# Patient Record
Sex: Female | Born: 1935 | Race: Black or African American | Hispanic: No | Smoking: Never smoker
Health system: Southern US, Community
[De-identification: ages and names within clinical notes are randomized; demographics above are authoritative.]

## PROBLEM LIST (undated history)

## (undated) DIAGNOSIS — M1990 Unspecified osteoarthritis, unspecified site: Secondary | ICD-10-CM

## (undated) DIAGNOSIS — I10 Essential (primary) hypertension: Secondary | ICD-10-CM

## (undated) HISTORY — DX: Essential (primary) hypertension: I10

## (undated) HISTORY — PX: HERNIA REPAIR: SHX51

## (undated) HISTORY — PX: ABDOMINAL HYSTERECTOMY: SHX81

## (undated) HISTORY — PX: APPENDECTOMY: SHX54

## (undated) HISTORY — DX: Unspecified osteoarthritis, unspecified site: M19.90

---

## 1997-08-21 ENCOUNTER — Ambulatory Visit (HOSPITAL_COMMUNITY): Admission: RE | Admit: 1997-08-21 | Discharge: 1997-08-21 | Payer: Self-pay | Admitting: Cardiology

## 1997-12-11 ENCOUNTER — Ambulatory Visit: Admission: RE | Admit: 1997-12-11 | Discharge: 1997-12-11 | Payer: Self-pay | Admitting: *Deleted

## 1998-12-17 ENCOUNTER — Ambulatory Visit (HOSPITAL_COMMUNITY): Admission: RE | Admit: 1998-12-17 | Discharge: 1998-12-17 | Payer: Self-pay | Admitting: *Deleted

## 1998-12-17 ENCOUNTER — Encounter: Payer: Self-pay | Admitting: *Deleted

## 1999-09-05 ENCOUNTER — Encounter: Payer: Self-pay | Admitting: *Deleted

## 1999-09-05 ENCOUNTER — Ambulatory Visit (HOSPITAL_COMMUNITY): Admission: RE | Admit: 1999-09-05 | Discharge: 1999-09-05 | Payer: Self-pay | Admitting: *Deleted

## 1999-12-18 ENCOUNTER — Encounter: Payer: Self-pay | Admitting: Cardiology

## 1999-12-18 ENCOUNTER — Ambulatory Visit (HOSPITAL_COMMUNITY): Admission: RE | Admit: 1999-12-18 | Discharge: 1999-12-18 | Payer: Self-pay | Admitting: Cardiology

## 2000-03-28 ENCOUNTER — Emergency Department (HOSPITAL_COMMUNITY): Admission: EM | Admit: 2000-03-28 | Discharge: 2000-03-28 | Payer: Self-pay | Admitting: Internal Medicine

## 2000-08-04 ENCOUNTER — Encounter: Payer: Self-pay | Admitting: Family Medicine

## 2000-08-04 ENCOUNTER — Encounter: Admission: RE | Admit: 2000-08-04 | Discharge: 2000-08-04 | Payer: Self-pay | Admitting: Family Medicine

## 2000-08-23 ENCOUNTER — Ambulatory Visit (HOSPITAL_COMMUNITY): Admission: RE | Admit: 2000-08-23 | Discharge: 2000-08-24 | Payer: Self-pay | Admitting: General Surgery

## 2000-08-23 ENCOUNTER — Encounter (INDEPENDENT_AMBULATORY_CARE_PROVIDER_SITE_OTHER): Payer: Self-pay | Admitting: *Deleted

## 2000-08-23 ENCOUNTER — Encounter (HOSPITAL_BASED_OUTPATIENT_CLINIC_OR_DEPARTMENT_OTHER): Payer: Self-pay | Admitting: General Surgery

## 2000-12-20 ENCOUNTER — Encounter: Payer: Self-pay | Admitting: Family Medicine

## 2000-12-20 ENCOUNTER — Ambulatory Visit (HOSPITAL_COMMUNITY): Admission: RE | Admit: 2000-12-20 | Discharge: 2000-12-20 | Payer: Self-pay | Admitting: Family Medicine

## 2001-07-28 ENCOUNTER — Other Ambulatory Visit: Admission: RE | Admit: 2001-07-28 | Discharge: 2001-07-28 | Payer: Self-pay | Admitting: Family Medicine

## 2001-10-04 ENCOUNTER — Encounter: Admission: RE | Admit: 2001-10-04 | Discharge: 2001-10-04 | Payer: Self-pay | Admitting: Family Medicine

## 2001-10-04 ENCOUNTER — Encounter: Payer: Self-pay | Admitting: Family Medicine

## 2001-11-02 ENCOUNTER — Encounter: Admission: RE | Admit: 2001-11-02 | Discharge: 2001-11-02 | Payer: Self-pay | Admitting: Family Medicine

## 2001-11-02 ENCOUNTER — Encounter: Payer: Self-pay | Admitting: Family Medicine

## 2001-11-10 ENCOUNTER — Encounter (HOSPITAL_BASED_OUTPATIENT_CLINIC_OR_DEPARTMENT_OTHER): Payer: Self-pay | Admitting: General Surgery

## 2001-11-14 ENCOUNTER — Ambulatory Visit (HOSPITAL_COMMUNITY): Admission: RE | Admit: 2001-11-14 | Discharge: 2001-11-15 | Payer: Self-pay | Admitting: General Surgery

## 2001-11-14 ENCOUNTER — Encounter (INDEPENDENT_AMBULATORY_CARE_PROVIDER_SITE_OTHER): Payer: Self-pay | Admitting: Specialist

## 2001-11-25 ENCOUNTER — Encounter: Payer: Self-pay | Admitting: Family Medicine

## 2001-11-25 ENCOUNTER — Encounter: Admission: RE | Admit: 2001-11-25 | Discharge: 2001-11-25 | Payer: Self-pay | Admitting: Family Medicine

## 2002-12-01 ENCOUNTER — Encounter: Admission: RE | Admit: 2002-12-01 | Discharge: 2002-12-01 | Payer: Self-pay | Admitting: Family Medicine

## 2002-12-01 ENCOUNTER — Encounter: Payer: Self-pay | Admitting: Family Medicine

## 2003-01-03 ENCOUNTER — Ambulatory Visit (HOSPITAL_COMMUNITY): Admission: RE | Admit: 2003-01-03 | Discharge: 2003-01-03 | Payer: Self-pay | Admitting: Family Medicine

## 2003-01-05 ENCOUNTER — Ambulatory Visit (HOSPITAL_COMMUNITY): Admission: RE | Admit: 2003-01-05 | Discharge: 2003-01-05 | Payer: Self-pay | Admitting: Family Medicine

## 2003-01-08 ENCOUNTER — Encounter (INDEPENDENT_AMBULATORY_CARE_PROVIDER_SITE_OTHER): Payer: Self-pay | Admitting: Specialist

## 2003-01-08 ENCOUNTER — Ambulatory Visit (HOSPITAL_COMMUNITY): Admission: RE | Admit: 2003-01-08 | Discharge: 2003-01-08 | Payer: Self-pay | Admitting: Vascular Surgery

## 2003-12-10 ENCOUNTER — Ambulatory Visit (HOSPITAL_COMMUNITY): Admission: RE | Admit: 2003-12-10 | Discharge: 2003-12-10 | Payer: Self-pay | Admitting: Gastroenterology

## 2003-12-13 ENCOUNTER — Ambulatory Visit (HOSPITAL_COMMUNITY): Admission: RE | Admit: 2003-12-13 | Discharge: 2003-12-13 | Payer: Self-pay | Admitting: Gastroenterology

## 2004-04-29 ENCOUNTER — Emergency Department (HOSPITAL_COMMUNITY): Admission: EM | Admit: 2004-04-29 | Discharge: 2004-04-29 | Payer: Self-pay | Admitting: Family Medicine

## 2004-06-17 ENCOUNTER — Encounter: Admission: RE | Admit: 2004-06-17 | Discharge: 2004-06-17 | Payer: Self-pay | Admitting: Family Medicine

## 2004-09-24 ENCOUNTER — Emergency Department (HOSPITAL_COMMUNITY): Admission: EM | Admit: 2004-09-24 | Discharge: 2004-09-24 | Payer: Self-pay | Admitting: Emergency Medicine

## 2005-06-19 ENCOUNTER — Encounter: Admission: RE | Admit: 2005-06-19 | Discharge: 2005-06-19 | Payer: Self-pay | Admitting: Family Medicine

## 2005-08-31 ENCOUNTER — Ambulatory Visit (HOSPITAL_COMMUNITY): Admission: RE | Admit: 2005-08-31 | Discharge: 2005-08-31 | Payer: Self-pay | Admitting: General Surgery

## 2006-07-09 ENCOUNTER — Encounter: Admission: RE | Admit: 2006-07-09 | Discharge: 2006-07-09 | Payer: Self-pay | Admitting: Family Medicine

## 2006-10-07 ENCOUNTER — Emergency Department (HOSPITAL_COMMUNITY): Admission: EM | Admit: 2006-10-07 | Discharge: 2006-10-07 | Payer: Self-pay | Admitting: Emergency Medicine

## 2006-10-12 ENCOUNTER — Encounter: Admission: RE | Admit: 2006-10-12 | Discharge: 2006-10-12 | Payer: Self-pay | Admitting: Family Medicine

## 2007-07-11 ENCOUNTER — Encounter: Admission: RE | Admit: 2007-07-11 | Discharge: 2007-07-11 | Payer: Self-pay | Admitting: Family Medicine

## 2008-02-27 ENCOUNTER — Encounter: Admission: RE | Admit: 2008-02-27 | Discharge: 2008-02-27 | Payer: Self-pay | Admitting: Family Medicine

## 2008-04-04 ENCOUNTER — Emergency Department (HOSPITAL_COMMUNITY): Admission: EM | Admit: 2008-04-04 | Discharge: 2008-04-04 | Payer: Self-pay | Admitting: Emergency Medicine

## 2008-04-10 ENCOUNTER — Ambulatory Visit (HOSPITAL_BASED_OUTPATIENT_CLINIC_OR_DEPARTMENT_OTHER): Admission: RE | Admit: 2008-04-10 | Discharge: 2008-04-10 | Payer: Self-pay | Admitting: Urology

## 2008-08-03 ENCOUNTER — Encounter: Admission: RE | Admit: 2008-08-03 | Discharge: 2008-08-03 | Payer: Self-pay | Admitting: Family Medicine

## 2010-02-27 LAB — URINALYSIS, ROUTINE W REFLEX MICROSCOPIC
Bilirubin Urine: NEGATIVE
Hemoglobin, Urine: NEGATIVE
Ketones, ur: NEGATIVE mg/dL
Nitrite: NEGATIVE
Protein, ur: NEGATIVE mg/dL
Specific Gravity, Urine: 1.011 (ref 1.005–1.030)
Urine Glucose, Fasting: NEGATIVE mg/dL
Urobilinogen, UA: 0.2 mg/dL (ref 0.0–1.0)
pH: 6 (ref 5.0–8.0)

## 2010-02-27 LAB — BASIC METABOLIC PANEL
BUN: 10 mg/dL (ref 6–23)
CO2: 26 mEq/L (ref 19–32)
Calcium: 9.5 mg/dL (ref 8.4–10.5)
Chloride: 108 mEq/L (ref 96–112)
Creatinine, Ser: 0.68 mg/dL (ref 0.4–1.2)
GFR calc Af Amer: >60 mL/min (ref >60)
GFR calc non Af Amer: >60 mL/min (ref >60)
Glucose, Bld: 83 mg/dL (ref 70–99)
Potassium: 4 mEq/L (ref 3.5–5.1)
Sodium: 141 mEq/L (ref 135–145)

## 2010-02-27 LAB — CBC
HCT: 39.3 % (ref 36.0–46.0)
Hemoglobin: 13.7 g/dL (ref 12.0–15.0)
MCH: 33 pg (ref 26.0–34.0)
MCHC: 34.9 g/dL (ref 30.0–36.0)
MCV: 94.7 fL (ref 78.0–100.0)
Platelets: 221 10*3/uL (ref 150–400)
RBC: 4.15 MIL/uL (ref 3.87–5.11)
RDW: 13.9 % (ref 11.5–15.5)
WBC: 4.9 10*3/uL (ref 4.0–10.5)

## 2010-02-27 LAB — URINE MICROSCOPIC-ADD ON

## 2010-02-27 LAB — PROTIME-INR
INR: 1.08 (ref 0.00–1.49)
Prothrombin Time: 14.2 seconds (ref 11.6–15.2)

## 2010-02-27 LAB — DIFFERENTIAL
Basophils Absolute: 0 10*3/uL (ref 0.0–0.1)
Basophils Relative: 0 % (ref 0–1)
Eosinophils Absolute: 0.2 10*3/uL (ref 0.0–0.7)
Eosinophils Relative: 5 % (ref 0–5)
Lymphocytes Relative: 38 % (ref 12–46)
Lymphs Abs: 1.9 10*3/uL (ref 0.7–4.0)
Monocytes Absolute: 0.4 10*3/uL (ref 0.1–1.0)
Monocytes Relative: 7 % (ref 3–12)
Neutro Abs: 2.5 10*3/uL (ref 1.7–7.7)
Neutrophils Relative %: 50 % (ref 43–77)

## 2010-03-03 ENCOUNTER — Encounter (INDEPENDENT_AMBULATORY_CARE_PROVIDER_SITE_OTHER): Payer: Self-pay | Admitting: Surgery

## 2010-03-03 ENCOUNTER — Ambulatory Visit (HOSPITAL_COMMUNITY)
Admission: RE | Admit: 2010-03-03 | Discharge: 2010-03-04 | Payer: Self-pay | Source: Home / Self Care | Attending: Surgery | Admitting: Surgery

## 2010-03-15 ENCOUNTER — Encounter: Payer: Self-pay | Admitting: Family Medicine

## 2010-03-16 NOTE — Op Note (Signed)
Sydney Mason, PAONE              ACCOUNT NO.:  [id number]  MEDICAL RECORD NO.:  [id number]          PATIENT TYPE:  OIB  LOCATION:  5151                         FACILITY:  [facility]  PHYSICIAN:  Velora Heckler, MD      DATE OF BIRTH:  14-Apr-1921  DATE OF PROCEDURE:  03/03/2010 DATE OF DISCHARGE:                              OPERATIVE REPORT   PREOPERATIVE DIAGNOSIS:  Incarcerated incisional hernia, recurrent.  POSTOPERATIVE DIAGNOSIS:  Incarcerated incisional hernia, recurrent.  PROCEDURES: 1. Repair of recurrent incarcerated ventral incisional hernia with     mesh. 2. Excision of previous mesh from abdominal wall.  SURGEON:  Velora Heckler, MD  ANESTHESIA:  General per Dr. Claybon Jabs.  ESTIMATED BLOOD LOSS:  Minimal.  PREPARATION:  ChloraPrep.  COMPLICATIONS:  None.  INDICATIONS:  The patient is an 77 year old black female from [place], [place].  She has a recurrent ventral incisional hernia.  This had previously been repaired by Dr. Dominga Ferry.  Hernia had recurred over the last year and had become progressively larger with intermittent discomfort.  She now comes to the operating room for repair of recurrent ventral incisional hernia.  BODY OF REPORT:  Procedure was done in OR #3 at the [facility].  The patient was brought to the operating room and placed in supine position on the operating room table.  Following administration of general anesthesia, the patient was prepped and draped in usual strict aseptic fashion.  After ascertaining that an adequate level of anesthesia had been achieved, the previous midline incision was reopened with a #15 blade.  Dissection was carried down through the subcutaneous tissues and the hernia sac was identified.  Hernia sac was dissected out.  It was opened.  It contains incarcerated omentum and a segment of transverse colon.  Using sharp dissection with Metzenbaum scissors, the omentum and transverse  colon were freed from the inside of the hernia sac and reduced back within the peritoneal cavity.  Hernia sac was then excised and discarded.  Fascial edges were dissected out circumferentially.  The previously placed mesh appears to have all contracted to the right side of the hernia defect.  Subcutaneous flaps were elevated circumferentially.  The old mesh and its suture material and titanium tacks are resected from the right side of the abdominal wall using the electrocautery for hemostasis.  The old mesh was submitted to pathology for review.  Next, the midline wound was closed with interrupted #1 Novafil simple sutures.  There was good approximation of the fascia of the abdominal wall without undue tension.  Fascial plane was developed circumferentially to allow for a 3-inch x 6-inch sheet of polypropylene mesh to be applied as an onlay.  The corners of the mesh were trimmed slightly.  Mesh was placed over the fascia and was then secured circumferentially with interrupted #1 and 0 Novafil simple sutures. Good hemostasis was noted.  Umbilicus was reaffixed to the abdominal wall with an interrupted 0 Novafil suture.  A 19-French Blake drain was brought in from right lower quadrant stab wound and placed anterior to the mesh.  Subcutaneous tissues were then  closed with interrupted 2-0 Vicryl sutures.  Skin was closed with stainless steel staples.  Drain was placed to bulb suction.  The patient was awakened from anesthesia and brought to the recovery room.  The patient tolerated the procedure well.     Velora Heckler, MD     TMG/MEDQ  D:  03/03/2010  T:  03/04/2010  Job:  [id number]  cc:   Renaye Rakers, M.D.  Electronically Signed by Darnell Level MD on 03/16/2010 09:58:01 AM

## 2010-03-16 NOTE — Discharge Summary (Signed)
  NAMESEMIRA, Sydney Mason              ACCOUNT NO.:  [id number]  MEDICAL RECORD NO.:  [id number]          PATIENT TYPE:  OIB  LOCATION:  5151                         FACILITY:  [facility]  PHYSICIAN:  Velora Heckler, MD      DATE OF BIRTH:  21-Jun-1921  DATE OF ADMISSION:  03/03/2010 DATE OF DISCHARGE:  03/04/2010                              DISCHARGE SUMMARY   REASON FOR ADMISSION:  Recurrent ventral incisional hernia.  BRIEF HISTORY:  The patient is an 77 year old black female from [place], [place].  She has a history of ventral hernia repaired by Dr. Leonie Man approximately 3 years ago with mesh. Over the past year she has noted recurrence.  It has become progressively larger and causes intermittent discomfort.  She was evaluated by Dr. Renaye Rakers and referred for repair.  HOSPITAL COURSE:  The patient was admitted on January 9 and taken directly to the operating room.  She underwent repair of recurrent incarcerated ventral incisional hernia.  She had removal of her previous mesh.  Postoperative course was straightforward.  The patient does have a subcutaneous drain in place.  She remained stable overnight and was prepared for discharge home on the first postoperative day.  DISCHARGE PLANNING:  The patient is discharged home today, March 04, 2010, in good condition, tolerating a regular diet, and ambulating independently.  Discharge medication include Vicodin for pain. Arrangements were made for home health nursing from [facility]. The patient will be seen back in my office at [facility] in 3 days for wound check and possible drain removal.  FINAL DIAGNOSIS:  Recurrent ventral incisional hernia, incarcerated.  CONDITION AT DISCHARGE:  Good.     Velora Heckler, MD     TMG/MEDQ  D:  03/04/2010  T:  03/04/2010  Job:  [id number]  cc:   Renaye Rakers, M.D.  Electronically Signed by Darnell Level MD on 03/16/2010 09:57:53 AM

## 2010-03-20 LAB — SURGICAL PCR SCREEN: MRSA, PCR: NEGATIVE

## 2010-06-10 LAB — URINE CULTURE: Culture: NO GROWTH

## 2010-06-10 LAB — URINALYSIS, ROUTINE W REFLEX MICROSCOPIC
Glucose, UA: NEGATIVE mg/dL
Protein, ur: NEGATIVE mg/dL
Urobilinogen, UA: 0.2 mg/dL (ref 0.0–1.0)

## 2010-06-10 LAB — URINE MICROSCOPIC-ADD ON

## 2010-06-10 LAB — BASIC METABOLIC PANEL
Chloride: 105 mEq/L (ref 96–112)
Creatinine, Ser: 0.71 mg/dL (ref 0.4–1.2)
GFR calc Af Amer: >60 mL/min (ref >60)

## 2010-06-10 LAB — CBC
Hemoglobin: 12.8 g/dL (ref 12.0–15.0)
RBC: 3.85 MIL/uL — ABNORMAL LOW (ref 3.87–5.11)
WBC: 4.8 10*3/uL (ref 4.0–10.5)

## 2010-07-08 NOTE — Op Note (Signed)
Sydney Mason, Sydney Mason              ACCOUNT NO.:  [id number]   MEDICAL RECORD NO.:  [id number]          PATIENT TYPE:  AMB   LOCATION:  NESC                         FACILITY:  [facility]   PHYSICIAN:  Ronald L. Earlene Plater, M.D.  DATE OF BIRTH:  19-Jul-1921   DATE OF PROCEDURE:  04/10/2008  DATE OF DISCHARGE:                               OPERATIVE REPORT   ASSISTANT:  Dr. Duane Boston.   PREOPERATIVE DIAGNOSIS:  Left distal ureteral stone.   POSTOPERATIVE DIAGNOSIS:  Left distal ureteral stone.   PROCEDURES:  1. Hand-assisted urethroscopy.  2. Left-sided retrograde pyelogram.  3. Left-sided ureteroscopy with laser lithotripsy and basket stone      extraction.  4. Left-sided double-J ureteral stent placement, 7-French x 24 cm.   INDICATIONS:  This is an 77 year old female with a history of left  distal ureteral stone.  After discussion of the risks and benefits, she  elected to proceed with a left ureteroscopic stone manipulation.   FINDINGS:  1. Left distal ureteral narrowing/stricture/impacted stone.  2. Uncomplicated laser lithotripsy of stone and basket of fragments.  3. Uncomplicated placement of left double-J stent.   PROCEDURE IN DETAIL:  The patient was brought back to the operating room  and after the successful induction of LMA anesthetic, she was placed in  the dorsal lithotomy position.  All pressure points were padded  appropriately and SCD were in place.   A timeout was performed and she received preoperative antibiotics.   Using a 22-French sheath with 30 and 70-degree lenses, pan  cystourethroscopy was performed.  The patient's urethra and bladder  appeared normal.  There was no evidence of any stricture, tumor, foreign  body, or other anomaly.  The left ureteral orifice was identified and  cannulated with a 5-French __________ catheter.   Left retrograde pyelogram was shot.  Distal ureteral narrowing could be  seen.  A filling defect consistent with a stone could be  seen just  proximal to that, and the ureter was dilated proximal to the filling  defect and ureteral narrowing.  At this point, we attempted to place a  sensor wire and were unsuccessful.  We used the Cobra catheter to guide  a Glidewire up into the upper collecting system.  Once this was done, it  served as a safety access and ureteroscopy was completed.  Distal  ureteroscopy showed distal ureteral narrowing and, with difficulty, we  were able to ultimately pass this narrowing and identify the stone.  The  stone was seen in the distal ureter and with a 300-micron laser, it was  dusted into small fragments at a setting of 0.6 Joules and 6 Hertz.  The  larger of the fragments, which were approximately 1-2 mm in size, were  grasped and removed from the ureter and sent for stone compositional  analysis.  Once all the 1-2 mm fragments had been removed and nothing  larger could be seen in the ureter, we elected to place a 7-French x 24-  cm stent over our safety wire.  Once this was done, good proximal and  distal curls were seen fluoroscopically.  The bladder was drained and  the procedure was ended.   Please note Dr. Earlene Plater was present throughout the entire case.   ESTIMATED BLOOD LOSS:  Minimal.   URINE OUTPUT:  Unrecorded.   DRAINS:  Left 7 x 24 cm ureteral stent.   SPECIMEN:  Stone for [employer].   DISPOSITION:  The patient will go to the PACU for further care.     ______________________________  Duane Boston, MD      Lucrezia Starch. Earlene Plater, M.D.  Electronically Signed    BP/MEDQ  D:  04/10/2008  T:  04/10/2008  Job:  [id number]

## 2010-07-11 NOTE — Op Note (Signed)
   Sydney Mason, Sydney Mason                        ACCOUNT NO.:  [id number]   MEDICAL RECORD NO.:  [id number]                   PATIENT TYPE:  OIB   LOCATION:  2899                                 FACILITY:  [facility]   PHYSICIAN:  Luisa Hart L. Lurene Shadow, M.D.             DATE OF BIRTH:  08-26-1921   DATE OF PROCEDURE:  11/14/2001  DATE OF DISCHARGE:                                 OPERATIVE REPORT   PREOPERATIVE DIAGNOSIS:  Ventral hernia.   POSTOPERATIVE DIAGNOSIS:  Ventral hernia.   PROCEDURE:  Repair of ventral hernia with mesh.   SURGEON:  Mardene Celeste. Lurene Shadow, M.D.   ASSISTANT:  Nurse.   ANESTHESIA:  General.   CLINICAL NOTE:  The patient is a 77 year old lady who presented with a  painful enlarging mass in the epigastrium over a previous cholecystectomy  scar.  This has been increasing in size, and she comes in now for repair.  A  full discussion of the risks and potential benefits of surgery has been  carried out with the patient, all questions answered, and she understands  the risks and gives consent to surgery.   DESCRIPTION OF PROCEDURE:  Following the induction of satisfactory general  endotracheal anesthesia, the patient was positioned supinely and the abdomen  prepped and draped routinely.  I made a midline incision above the  umbilicus, deepening this through the skin and subcutaneous tissue, carrying  the dissection down to this large hernia sac, which was dissected free on  all sides, carrying the dissection down to the fascia.  The hernia was  opened.  A few adhesions to the hernia were taken down.  The hernia sac was  then excised in its entirety.  The edges then prepared for repair.  I sewed  in a section of polypropylene mesh over a slurry of Seprafilm so as to  prevent adhesions.  The mesh was sewn in with a running suture of #1  Novofil.  The repair was noted to be intact.  The sponge, instrument, and  sharp counts verified.  Subcutaneous tissues were closed with a  running 2-0  Vicryl suture and the skin closed with a running 4-0 Monocryl suture and  then reinforced with Steri-Strips.  Sterile dressings were applied, the  anesthetic reversed, and the patient removed from the operating room to the  recovery room in stable condition.  She tolerated the procedure well .                                               Mardene Celeste Lurene Shadow, M.D.    PLB/MEDQ  D:  11/14/2001  T:  11/15/2001  Job:  [id number]

## 2010-07-11 NOTE — Op Note (Signed)
[facility]  Patient:    Sydney Mason, Sydney Mason                       MRN: [id number] Proc. Date: 08/23/00 Adm. Date:  [id number] Attending:  Sonda Primes CC:         Osvaldo Shipper. Spruill, M.D.   Operative Report  PREOPERATIVE DIAGNOSIS:  Chronic calculus cholecystitis.  POSTOPERATIVE DIAGNOSIS:  Chronic calculus cholecystitis.  PROCEDURE PERFORMED:  Laparoscopic cholecystectomy with intraoperative cholangiogram.  SURGEON:  Luisa Hart L. Lurene Shadow, M.D.  ASSISTANT:  Marnee Spring. Wiliam Ke, M.D.  ANESTHESIA:  General.  INDICATIONS:  This patient is a 77 year old lady presenting with symptoms of upper abdominal pain, nausea, bloating, and diarrhea.  Investigation shows cholelithiasis on ultrasound.  She is brought to the operating room now for a laparoscopic cholecystectomy.  DESCRIPTION OF PROCEDURE:  Following the induction of anesthesia, the patient is positioned supine, and the abdomen is prepped and draped to be included in the sterile operative field.  An open laparoscopy is created at the umbilicus with insufflation of the peritoneal cavity to 14 mmHg pressure.  The camera is inserted and visual exploration of the abdomen carried out.  The small and large intestine appeared to be normal.  The liver was somewhat sugar coated. The liver edges were sharp.  The anterior gastric lumen and duodenal sweep appeared to be normal.  There were multiple adhesions of the gallbladder, which was chronically scarred.  Under direct vision, epigastric and lateral ports were placed, and the gallbladder was grasped and retracted cephalad.  Adhesions to the gallbladder were taken down bluntly and with electrocautery, and dissection carried down to the region of the hepatoduodenal ligament where the cystic duct was isolated.  The cystic artery was somewhat higher up within the liver bed, and that too was later isolated, doubly clipped, and transected.  The cystic  duct was clipped proximally and opened.  A cystic duct cholangiogram then carried out by inserting a ready catheter through a 14 gauge angiocath into the cystic duct, and injecting .5 strength Hypaque into the biliary system.  The resulting cholangiogram showed free flow of contrast into the duodenum with no filling defect within any of hepatic radicles.  The common bile duct was mildly dilated consistent with this patients age.  The cystic duct cholangiogram was then removed and the cystic duct was doubly clipped and transected.  The gallbladder was then dissected free from the liver bed using electrocautery maintaining hemostasis throughout the course of the dissection.  At the end of the dissection, the liver bed was checked, and hemostasis was noted to be good.  The right upper quadrant was then thoroughly irrigated with normal saline.  The camera was moved to the epigastric port and the gallbladder retrieved with an endopouch through the umbilical port without difficulty.  The right upper quadrant was again aspirated completely.  Trocars were removed under direct vision.  The abdomen was evacuated of the carbon dioxide.  Sponge, instrument, and sharp counts were verified.  The wounds were then closed in layers as follows.  The umbilical wound was closed in two layers with 0 Dexon and 4-0 Dexon sutures.  Epigastric and lateral flank wounds were closed with 4-0 Dexon sutures.  All wounds were reinforced with Steri-Strips and sterile dressings applied.  Anesthesia was reversed.  The patient was moved from the operating room to the recovery room in stable condition.  She tolerated the procedure well. DD:  08/23/00 TD:  08/23/00 Job: [id number]

## 2010-07-11 NOTE — Op Note (Signed)
NAMEKATAYA, GUIMONT              ACCOUNT NO.:  [id number]   MEDICAL RECORD NO.:  [id number]          PATIENT TYPE:  AMB   LOCATION:  ENDO                         FACILITY:  [facility]   PHYSICIAN:  Anselmo Rod, M.D.  DATE OF BIRTH:  [birth year]   DATE OF PROCEDURE:  12/10/2003  DATE OF DISCHARGE:                                 OPERATIVE REPORT   PROCEDURE PERFORMED:  Screening colonoscopy.   ENDOSCOPIST:  Anselmo Rod, M.D.   INSTRUMENT USED:  Olympus video colonoscope.   INDICATION FOR PROCEDURE:  [age]-year-old African American female with  history of abdominal pain prior to bowel movement, undergoing a screening  colonoscopy.  The patient has had worsening constipation in the recent past,  rule out colon polyps, masses, etc.   PREPROCEDURE PREPARATION:  Informed consent was procured from the patient.  The patient was fasted for 8 hours prior to the procedure and prepped with a  bottle of magnesium citrate and a gallon of GoLYTELY the night prior to the  procedure.   PREPROCEDURE PHYSICAL:  VITAL SIGNS:  The patient had stable vital signs.  NECK:  Neck supple.  CHEST:  Chest clear to auscultation.  S1 and S2 regular.  ABDOMEN:  Abdomen soft with normal bowel sounds.   DESCRIPTION OF THE PROCEDURE:  The patient was placed in the left lateral  decubitus position and sedated with 50 mg of Demerol and 5 mg of Versed in  slow incremental doses.  Once the patient was adequately sedated and  maintained on low-flow oxygen and continuous cardiac monitoring, the Olympus  video colonoscope was advanced from the rectum to the distal right colon  with extreme difficulty.  The patient's position was changed from the left  lateral to the supine and the right lateral position with gentle application  of abdominal pressure to reach the cecum, however, visualization of the  right colon was not possible secondary to a large amount of residual stool  in the redundant colon.  Sigmoid  diverticulosis was noted.  No masses or  polyps were identified.  There was a large amount of debris in the  transverse and left colon as well and therefore visualization was limited.  The patient tolerated the procedure without complication.  Retroflexion in  the rectum revealed no abnormalities.   IMPRESSION:  1.  Sigmoid diverticulosis.  2.  Very redundant colon, right colon not visualized.   RECOMMENDATION:  1.  An air contrast barium enema has been scheduled for the patient on      December 13, 2003.  Further recommendation will be made once those      results have been procured.  2.  Brochures on diverticulosis have been given to the patient for education      and a high-fiber diet has been advised along with liberal fluid intake.  3.  Outpatient followup after the air contrast barium enema has been done.       JNM/MEDQ  D:  12/10/2003  T:  12/11/2003  Job:  [id number]   cc:   Renaye Rakers, M.D.  [phone]  [place]  [place] [address]  Fax: [phone]

## 2010-07-11 NOTE — Op Note (Signed)
Sydney Mason, Sydney Mason                        ACCOUNT NO.:  [id number]   MEDICAL RECORD NO.:  [id number]                   PATIENT TYPE:  OIB   LOCATION:  2870                                 FACILITY:  [facility]   PHYSICIAN:  Larina Earthly, M.D.                 DATE OF BIRTH:  [birth year]   DATE OF PROCEDURE:  01/08/2003  DATE OF DISCHARGE:  01/08/2003                                 OPERATIVE REPORT   PREOPERATIVE DIAGNOSIS:  Headache with possible temporal arteritis.   POSTOPERATIVE DIAGNOSIS:  Headache with possible temporal arteritis.   PROCEDURE:  Left temporal artery biopsy.   SURGEON:  Larina Earthly, M.D.   ASSISTANT:  Nurse.   ANESTHESIA:  MAC.   COMPLICATIONS:  None.   DISPOSITION:  To recovery room stable.   PROCEDURE IN DETAIL:  The patient was taken to the operating room and placed  in the supine position where the area of the left preauricular area was  prepped and draped in the usual sterile fashion.  Using local anesthesia,  incision was made anterior to the ear, carried down to the subcutaneous  tissue to isolate the temporal artery.  The artery was mobilized over  several cm and was ligated proximally and distally with 3-0 silk ties.  The  segment of temporal artery biopsy was removed and sent for permanent  specimen.  The hemostasis was obtained with electrocautery.  The wound was  irrigated with saline and was closed with a 4-0 subcuticular Vicryl stitch.  Sterile dressing was applied, and the patient was taken to the recovery room  in stable condition.                                               Larina Earthly, M.D.    TFE/MEDQ  D:  03/05/2003  T:  03/05/2003  Job:  [id number]

## 2010-07-11 NOTE — Op Note (Signed)
NAMEAUSTINE, Sydney Mason              ACCOUNT NO.:  [id number]   MEDICAL RECORD NO.:  [id number]          PATIENT TYPE:  AMB   LOCATION:  SDS                          FACILITY:  [facility]   PHYSICIAN:  Leonie Man, M.D.   DATE OF BIRTH:  [birth year]   DATE OF PROCEDURE:  08/31/2005  DATE OF DISCHARGE:                                 OPERATIVE REPORT   PREOPERATIVE DIAGNOSIS:  Recurrent ventral hernia.   POSTOPERATIVE DIAGNOSIS:  Recurrent ventral hernia.   PROCEDURE:  Repair recurrent ventral hernia with mesh.   SURGEON:  Dr. Lurene Shadow.   ASSISTANT:  OR tech.   ANESTHESIA:  General.   NOTE:  [name] is an 77 year old lady, status post total abdominal  hysterectomy and laparoscopic cholecystectomy and subsequently she developed  a ventral hernia, which was repaired with mesh in 2004.  She returns now  with a recurrent hernia just above the umbilicus in the epigastrium.  This  has been causing her some significant pain, particularly when she comes  constipated  and has to strain her stool.  She has had no history of  incarcerations.   The patient comes to the operating room now after the risks and potential  benefits of surgery have been discussed.  All questions answered and consent  obtained.   PROCEDURE:  The patient positioned supine following the induction of  satisfactory general anesthesia.  The abdomen is prepped and draped to be  included in the sterile operative field.  The old supraumbilical scar is  opened up through the midline, deepened through skin and subcutaneous tissue  with dissection carried down to a large hernia sac.  Sac is dissected free  on all sides  with dissection carried down to the defect, which was  approximately 5 x 6 cm in size.  The contents of the sac was then reduced  back into the peritoneal cavity.  Traction sutures placed approximately 4 cm  away from the fascial edges, were then placed into full-thickness through  the abdomen.  Then the  sponge and instrument counts verified.  The midline  was closed with running #1 Novofil suture.  [place] mesh patch was placed over  the repair using the previously placed traction sutures to hold the mesh in  place.  All the traction sutures were then tied down and then with a  endoscopic tacker the spaces between the sutures were filled with endoscopic  Taxol and the mesh firmly in place.  Two rows of  tacks placed so as to  secure the mesh to the fascia.  Sponge and instrument counts were again  verified.  The wound was thoroughly irrigated with saline and the  subcutaneous tissues closed with a running suture of 2-0 Vicryl.  Skin was  closed with 4-0 Monocryl and reinforced with Steri-Strips.  Sterile  dressings applied.  Anesthetic reversed.  The patient removed from the  operating room to the recovery room in stable condition.  She tolerated the  procedure well.      Leonie Man, M.D.  Electronically Signed     PB/MEDQ  D:  08/31/2005  T:  08/31/2005  Job:  [id number]

## 2010-07-11 NOTE — Procedures (Signed)
[place]  Patient:    TORIE, TOWLE                       MRN: [id number] Proc. Date: 09/05/99 Adm. Date:  [id number] Attending:  Sharyn Dross CC:         Osvaldo Shipper. Spruill, M.D.                           Procedure Report  REFERRING PHYSICIAN:  Osvaldo Shipper. Spruill, M.D.  PREOPERATIVE DIAGNOSIS:  Blood in stools.  POSTOPERATIVE DIAGNOSIS:  Normal colonoscopic examination to the hepatic flexure.  PROCEDURES:  Colonoscopy.  MEDICATION:  Demerol 50 mg IV and Versed 6 mg IV over a 10-minute period of time.  INSTRUMENT:  Olympus video pancolonoscope.  ENDOSCOPIST:  Sharyn Dross., M.D.  INDICATIONS:  This pleasant female was referred for an evaluation because of blood in stools that was present.  The patient has been noticing these symptoms that have been intermittent at this time.  It was confirmed by her primary physician and she was subsequently referred for an evaluation.  There is no history of family history of any colon cancer or polypoid lesions that was present.  There is no history of any hematemesis, peptic ulcer disease, or gallbladder or liver disease noted.  OBJECTIVE FINDINGS:  She is a pleasant female, obese, in no acute distress. Her vital signs are stable.  Her HEENT examination is anicteric.  The neck was supple.  The lungs were clear.  The heart had a regular rate and rhythm without heaves, thrills, murmurs, or gallops.  The abdomen was soft and nontender.  There was no hepatosplenomegaly that was appreciated.  Abdominal scars appreciated from previous surgery performed.  The extremities are within normal limits.  PLAN:  To proceed with the colonoscopic examination.  INFORMED CONSENT:  The patient was advised of the procedure, the indications, and the risks involved.  The patient has agreed to have the procedure performed.  A video was reviewed and consent form obtained.  PREOPERATIVE PREPARATION:  The patient was  brought into the endoscopy unit where an IV for IV sedating medication was started.  A monitor was placed on the patient to monitor the patients vital signs and oxygen saturation.  Nasal oxygen a 2 L/min was used.  After adequate sedation was performed, the procedure was begun.  BOWEL PREPARATION:  The patient was given GoLYTELY and Reglan as a bowel prep. The quality of the prep was excellent.  The patient tolerated the prep well without any complications.  DESCRIPTION OF PROCEDURE:  The instrument was advanced with the patient lying in the left lateral position approximately 1600 cm from the proximal colon to the hepatic flexure region.  There appeared to be no gross abnormalities, such as masses or stricture lesions appreciated.  The vascular pattern appeared to be well within normal limits throughout the entire colon.  The mucosa pattern showed no evidence of any diverticular changes or granular changes up to this point.  During the process of the procedure, suction was lost from the instrument at this time.  With multiple manipulations and attempts to correct this process, it was unable to correct.  Because of the patients discomfort during the process at this point, as well as advancement of the entire instrument into the region without any further provocation of the instrument beyond the point of the hepatic flexure region, the procedure was subsequently  terminated at this time.  The instrument was removed per rectum without difficulty without any evidence of any internal or external hemorrhoids noted.  The patient tolerated the procedure well without any complications.  TREATMENT:  I am going to recommend performing a barium enema on the right side of the colon to rule out any evidence of lesions that are present.  An attempt to try today will be requested, however, if there are technical difficulties or reasons why it cannot be done (because of increased gas present in the  rectal region), would recommend proceeding at a different time.  LEVEL OF DIFFICULTY:  Approximately 2/5.  RECOMMENDATIONS:  Would use the standard colonoscope for advancement of the instrument.  However, the pediatric scope may be better as far as some tortuous regions that were appreciated. DD:  09/05/99 TD:  09/06/99 Job: 2017 [id number]

## 2010-12-08 LAB — URINE MICROSCOPIC-ADD ON

## 2010-12-08 LAB — URINALYSIS, ROUTINE W REFLEX MICROSCOPIC
Glucose, UA: NEGATIVE
Specific Gravity, Urine: 1.011
pH: 6

## 2011-07-23 ENCOUNTER — Other Ambulatory Visit (HOSPITAL_COMMUNITY): Payer: Self-pay | Admitting: Family Medicine

## 2011-07-23 DIAGNOSIS — R1310 Dysphagia, unspecified: Secondary | ICD-10-CM

## 2011-07-24 ENCOUNTER — Encounter (INDEPENDENT_AMBULATORY_CARE_PROVIDER_SITE_OTHER): Payer: Self-pay | Admitting: General Surgery

## 2011-07-29 ENCOUNTER — Encounter (INDEPENDENT_AMBULATORY_CARE_PROVIDER_SITE_OTHER): Payer: Self-pay | Admitting: Surgery

## 2011-07-29 ENCOUNTER — Ambulatory Visit (INDEPENDENT_AMBULATORY_CARE_PROVIDER_SITE_OTHER): Payer: MEDICARE | Admitting: Surgery

## 2011-07-29 VITALS — BP 122/86 | HR 68 | Temp 97.9°F | Resp 16 | Ht 61.0 in | Wt 143.8 lb

## 2011-07-29 DIAGNOSIS — R109 Unspecified abdominal pain: Secondary | ICD-10-CM

## 2011-07-29 MED ORDER — POLYETHYLENE GLYCOL 3350 17 GM/SCOOP PO POWD: 17.0000 g | Freq: Every day | ORAL | Status: AC

## 2011-07-29 NOTE — Patient Instructions (Signed)
Begin Miralax (generic or store brand is fine) once or twice daily every day.  Velora Heckler, MD, [facility] Office: [phone]

## 2011-07-29 NOTE — Progress Notes (Signed)
Visit Diagnoses: 1. Abdominal pain, intermittent, right lower quadrant     HISTORY: Patient is an 78 year old black female with a history of ventral incisional hernia repair with mesh. She presents with intermittent right lower quadrant abdominal pain. This occurs mostly before bowel movements or when she is constipated.  Patient denies any bleeding per rectum.  PERTINENT REVIEW OF SYSTEMS: Denies bleeding per rectum. Denies any signs or symptoms of recurrent herniation.  EXAM: HEENT: normocephalic; pupils equal and reactive; sclerae clear; dentition good; mucous membranes moist NECK:  symmetric on extension; no palpable anterior or posterior cervical lymphadenopathy; no supraclavicular masses; no tenderness CHEST: clear to auscultation bilaterally without rales, rhonchi, or wheezes CARDIAC: regular rate and rhythm without significant murmur; peripheral pulses are full ABDOMEN: Abdomen is soft without distention. Surgical wounds are well healed. Patient is examined both in a recumbent and a standing position. I see no sign of recurrent ventral hernia. I see no sign of hernia in the right lower quadrant or the right groin. EXT:  non-tender without edema; no deformity NEURO: no gross focal deficits; no sign of tremor   IMPRESSION: Intermittent right lower quadrant abdominal pain, likely related to chronic constipation  PLAN: Patient will start on MiraLax once or twice daily for the next few months. Hopefully this will provide symptomatic relief. If the patient has persistent symptoms, she should return for further evaluation which may require radiographic imaging.  Patient will return as needed.  Velora Heckler, MD, FACS General & Endocrine Surgery [facility]

## 2011-07-31 ENCOUNTER — Other Ambulatory Visit (HOSPITAL_COMMUNITY): Payer: Self-pay

## 2011-07-31 ENCOUNTER — Ambulatory Visit (HOSPITAL_COMMUNITY)
Admission: RE | Admit: 2011-07-31 | Discharge: 2011-07-31 | Disposition: A | Discharge status: Home / Self Care | Payer: Self-pay | Source: Ambulatory Visit | Attending: Family Medicine | Admitting: Family Medicine

## 2011-07-31 DIAGNOSIS — R1310 Dysphagia, unspecified: Secondary | ICD-10-CM | POA: Insufficient documentation

## 2012-01-26 ENCOUNTER — Other Ambulatory Visit: Payer: Self-pay | Admitting: Family Medicine

## 2012-01-26 DIAGNOSIS — Z1231 Encounter for screening mammogram for malignant neoplasm of breast: Secondary | ICD-10-CM

## 2012-12-08 ENCOUNTER — Other Ambulatory Visit: Payer: Self-pay

## 2012-12-08 DIAGNOSIS — Z1231 Encounter for screening mammogram for malignant neoplasm of breast: Secondary | ICD-10-CM

## 2012-12-27 ENCOUNTER — Ambulatory Visit: Payer: Self-pay

## 2013-01-12 ENCOUNTER — Ambulatory Visit
Admission: RE | Admit: 2013-01-12 | Discharge: 2013-01-12 | Disposition: A | Discharge status: Home / Self Care | Payer: Medicare Other | Source: Ambulatory Visit

## 2013-01-12 DIAGNOSIS — Z1231 Encounter for screening mammogram for malignant neoplasm of breast: Secondary | ICD-10-CM

## 2013-06-13 ENCOUNTER — Emergency Department (HOSPITAL_COMMUNITY): Payer: Medicare Other

## 2013-06-13 ENCOUNTER — Encounter (HOSPITAL_COMMUNITY): Payer: Self-pay | Admitting: Emergency Medicine

## 2013-06-13 ENCOUNTER — Emergency Department (HOSPITAL_COMMUNITY)
Admission: EM | Admit: 2013-06-13 | Discharge: 2013-06-13 | Disposition: A | Discharge status: Home / Self Care | Payer: Medicare Other | Attending: Emergency Medicine | Admitting: Emergency Medicine

## 2013-06-13 DIAGNOSIS — Z8739 Personal history of other diseases of the musculoskeletal system and connective tissue: Secondary | ICD-10-CM | POA: Insufficient documentation

## 2013-06-13 DIAGNOSIS — K5900 Constipation, unspecified: Secondary | ICD-10-CM

## 2013-06-13 DIAGNOSIS — I10 Essential (primary) hypertension: Secondary | ICD-10-CM | POA: Insufficient documentation

## 2013-06-13 DIAGNOSIS — R198 Other specified symptoms and signs involving the digestive system and abdomen: Secondary | ICD-10-CM

## 2013-06-13 DIAGNOSIS — K594 Anal spasm: Secondary | ICD-10-CM | POA: Insufficient documentation

## 2013-06-13 DIAGNOSIS — Z79899 Other long term (current) drug therapy: Secondary | ICD-10-CM | POA: Insufficient documentation

## 2013-06-13 LAB — CBC WITH DIFFERENTIAL/PLATELET
BASOS ABS: 0 10*3/uL (ref 0.0–0.1)
Basophils Relative: 0 % (ref 0–1)
Eosinophils Absolute: 0.1 10*3/uL (ref 0.0–0.7)
Eosinophils Relative: 2 % (ref 0–5)
HCT: 38.7 % (ref 36.0–46.0)
Hemoglobin: 13.2 g/dL (ref 12.0–15.0)
LYMPHS ABS: 1.8 10*3/uL (ref 0.7–4.0)
LYMPHS PCT: 41 % (ref 12–46)
MCH: 32.8 pg (ref 26.0–34.0)
MCHC: 34.1 g/dL (ref 30.0–36.0)
MCV: 96 fL (ref 78.0–100.0)
Monocytes Absolute: 0.4 10*3/uL (ref 0.1–1.0)
Monocytes Relative: 10 % (ref 3–12)
NEUTROS ABS: 2.1 10*3/uL (ref 1.7–7.7)
Neutrophils Relative %: 47 % (ref 43–77)
PLATELETS: 196 10*3/uL (ref 150–400)
RBC: 4.03 MIL/uL (ref 3.87–5.11)
RDW: 13.9 % (ref 11.5–15.5)
WBC: 4.4 10*3/uL (ref 4.0–10.5)

## 2013-06-13 LAB — URINALYSIS, ROUTINE W REFLEX MICROSCOPIC
Bilirubin Urine: NEGATIVE
GLUCOSE, UA: NEGATIVE mg/dL
HGB URINE DIPSTICK: NEGATIVE
Ketones, ur: NEGATIVE mg/dL
Nitrite: NEGATIVE
PH: 8 (ref 5.0–8.0)
Protein, ur: NEGATIVE mg/dL
SPECIFIC GRAVITY, URINE: 1.012 (ref 1.005–1.030)
Urobilinogen, UA: 1 mg/dL (ref 0.0–1.0)

## 2013-06-13 LAB — URINE MICROSCOPIC-ADD ON

## 2013-06-13 LAB — BASIC METABOLIC PANEL
BUN: 9 mg/dL (ref 6–23)
CHLORIDE: 105 meq/L (ref 96–112)
CO2: 23 meq/L (ref 19–32)
Calcium: 9.5 mg/dL (ref 8.4–10.5)
Creatinine, Ser: 0.51 mg/dL (ref 0.50–1.10)
GFR calc Af Amer: >90 mL/min (ref >90)
GFR calc non Af Amer: 82 mL/min — ABNORMAL LOW (ref >90)
GLUCOSE: 77 mg/dL (ref 70–99)
POTASSIUM: 3.9 meq/L (ref 3.7–5.3)
SODIUM: 143 meq/L (ref 137–147)

## 2013-06-13 MED ORDER — FLEET ENEMA 7-19 GM/118ML RE ENEM
1.0000 | ENEMA | Freq: Once | RECTAL | Status: AC
  Administered 2013-06-13: 1 via RECTAL
  Filled 2013-06-13: qty 1

## 2013-06-13 MED ORDER — POLYETHYLENE GLYCOL 3350 17 GM/SCOOP PO POWD: ORAL | Status: AC

## 2013-06-13 MED ORDER — BELLADONNA-OPIUM 16.2-30 MG RE SUPP: 30.0000 mg | Freq: Three times a day (TID) | RECTAL | Status: AC | PRN

## 2013-06-13 NOTE — ED Provider Notes (Signed)
CSN: [id number]     Arrival date & time 06/13/13  [id number] History   First MD Initiated Contact with Patient 06/13/13 [phone]     Chief Complaint  Patient presents with  . Hemorrhoids     (Consider location/radiation/quality/duration/timing/severity/associated sxs/prior Treatment) HPI  Sydney Mason Is a very pleasant 80 year old female who presents emergency Department with chief complaint of hemorrhoid pain.  The patient states he has had problems with her hemorrhoids on and off for the past 72 years after giving birth to her third child.  She states that over the past 4 days she's been having severe, sharp, stabbing rectal pain which is constant, worse when sitting and relieved when lying on her side.  She's had associated constipation, her last bowel movement was 3 days ago after taking a laxative.  She states it was a small bowel movement for her.  She states she has been using hemorrhoid suppositories with no relief of her pain.  She denies any discharge, bleeding, severe swelling or pain around her anus.  The patient has a past surgical history multiple abdominal hernia repairs.  She denies any severe abdominal pain. Denies fevers, chills, myalgias, arthralgias. Denies DOE, SOB, chest tightness or pressure, radiation to left arm, jaw or back, or diaphoresis. Denies dysuria, flank pain, suprapubic pain, frequency, urgency, or hematuria. Denies headaches, light headedness, weakness, visual disturbances. Denies abdominal pain, nausea, vomiting, diarrhea.    Past Medical History  Diagnosis Date  . Hypertension   . Arthritis    Past Surgical History  Procedure Laterality Date  . Appendectomy    . Abdominal hysterectomy    . Hernia repair     History reviewed. No pertinent family history. History  Substance Use Topics  . Smoking status: Never Smoker   . Smokeless tobacco: Never Used  . Alcohol Use: No   OB History   Grav Para Term Preterm Abortions TAB SAB Ect Mult Living         Review of Systems  Ten systems reviewed and are negative for acute change, except as noted in the HPI.    Allergies  Review of patient's allergies indicates no known allergies.  Home Medications   Prior to Admission medications   Medication Sig Start Date End Date Taking? Authorizing Provider  ALPRAZolam Prudy Feeler(XANAX) 0.5 MG tablet Take 0.5 mg by mouth at bedtime.    Yes Historical Provider, MD  amLODipine (NORVASC) 10 MG tablet Take 10 mg by mouth daily.   Yes Historical Provider, MD  benazepril (LOTENSIN) 20 MG tablet Take 20 mg by mouth daily.   Yes Historical Provider, MD  brimonidine (ALPHAGAN P) 0.1 % SOLN Place 1 drop into both eyes 3 (three) times daily.   Yes Historical Provider, MD  DORZOLAMIDE HCL-TIMOLOL MAL OP Place 1 drop into both eyes 2 (two) times daily.   Yes Historical Provider, MD  doxepin (SINEQUAN) 10 MG capsule Take 10 mg by mouth at bedtime.   Yes Historical Provider, MD   BP 136/83  Pulse 71  Temp(Src) 98.8 F (37.1 C) (Oral)  Resp 17  SpO2 98% Physical Exam Physical Exam  Nursing note and vitals reviewed. Constitutional: She is oriented to person, place, and time. She appears well-developed and well-nourished. No distress.  HENT:  Head: Normocephalic and atraumatic.  Eyes: Conjunctivae normal and EOM are normal. Pupils are equal, round, and reactive to light. No scleral icterus.  Neck: Normal range of motion.  Cardiovascular: Normal rate, regular rhythm and normal heart sounds.  Exam  reveals no gallop and no friction rub.   No murmur heard. Pulmonary/Chest: Effort normal and breath sounds normal. No respiratory distress.  Abdominal: Soft. Bowel sounds are normal. She exhibits no distension and no mass. There is no tenderness. There is no guarding.  multiple well-healed surgical scars on the abdomen. Neurological: She is alert and oriented to person, place, and time.  Skin: Skin is warm and dry. She is not diaphoretic.  Rectal exam reveals rectum  with good tone, several non-thrombosed external hemorrhoids are present.  She has no tenderness to palpation of the the anus.  ED Course  Procedures (including critical care time) Labs Review Labs Reviewed  CBC WITH DIFFERENTIAL  BASIC METABOLIC PANEL  URINALYSIS, ROUTINE W REFLEX MICROSCOPIC    Imaging Review No results found.   EKG Interpretation None      MDM   Final diagnoses:  None    9:53 AM BP 136/83  Pulse 71  Temp(Src) 98.8 F (37.1 C) (Oral)  Resp 17  SpO2 98% Patient with no evidence of thrombosed external hemorrhoids.  She denies any fever chills nausea vomiting.  Concern for constipation first days proctalgia fugax first is possible perirectal abscess.  Obtaining labs and acute abdominal film will evaluate film for a large stool burn in.  If she has severely abnormal labs such as elevated white blood cell count and left shift I may progress to a CT scan of the pelvis to rule out rectal abscess   Patientabdominal plain films show very large stool burden. Will proceed with enema.   Patient given 2 enemas with sig. Relief. She c/o urgency to defecate but pain is decreased On DRE no stool can be palpated in the rectal vault. Will d/c patient wil miralax and B&O suppositories. Patient is tofollow up with her PCP. Return precautions discussed.  Arthor Captainbigail Shawn Carattini, PA-C 06/13/13 2158

## 2013-06-13 NOTE — ED Notes (Signed)
Patient transported to X-ray 

## 2013-06-13 NOTE — ED Notes (Signed)
Pt from home with c/o hemorrhoid pain when sitting and difficulty with bowel movements.  Last BM on Sat.  Pt in NAD A&O.

## 2013-06-13 NOTE — Discharge Instructions (Signed)
Constipation, Adult °Constipation is when a person has fewer than 3 bowel movements a week; has difficulty having a bowel movement; or has stools that are dry, hard, or larger than normal. As people grow older, constipation is more common. If you try to fix constipation with medicines that make you have a bowel movement (laxatives), the problem may get worse. Long-term laxative use may cause the muscles of the colon to become weak. A low-fiber diet, not taking in enough fluids, and taking certain medicines may make constipation worse. °CAUSES  °· Certain medicines, such as antidepressants, pain medicine, iron supplements, antacids, and water pills.   °· Certain diseases, such as diabetes, irritable bowel syndrome (IBS), thyroid disease, or depression.   °· Not drinking enough water.   °· Not eating enough fiber-rich foods.   °· Stress or travel. °· Lack of physical activity or exercise. °· Not going to the restroom when there is the urge to have a bowel movement. °· Ignoring the urge to have a bowel movement. °· Using laxatives too much. °SYMPTOMS  °· Having fewer than 3 bowel movements a week.   °· Straining to have a bowel movement.   °· Having hard, dry, or larger than normal stools.   °· Feeling full or bloated.   °· Pain in the lower abdomen. °· Not feeling relief after having a bowel movement. °DIAGNOSIS  °Your caregiver will take a medical history and perform a physical exam. Further testing may be done for severe constipation. Some tests may include:  °· A barium enema X-ray to examine your rectum, colon, and sometimes, your small intestine. °· A sigmoidoscopy to examine your lower colon. °· A colonoscopy to examine your entire colon. °TREATMENT  °Treatment will depend on the severity of your constipation and what is causing it. Some dietary treatments include drinking more fluids and eating more fiber-rich foods. Lifestyle treatments may include regular exercise. If these diet and lifestyle recommendations  do not help, your caregiver may recommend taking over-the-counter laxative medicines to help you have bowel movements. Prescription medicines may be prescribed if over-the-counter medicines do not work.  °HOME CARE INSTRUCTIONS  °· Increase dietary fiber in your diet, such as fruits, vegetables, whole grains, and beans. Limit high-fat and processed sugars in your diet, such as French fries, hamburgers, cookies, candies, and soda.   °· A fiber supplement may be added to your diet if you cannot get enough fiber from foods.   °· Drink enough fluids to keep your urine clear or pale yellow.   °· Exercise regularly or as directed by your caregiver.   °· Go to the restroom when you have the urge to go. Do not hold it. °· Only take medicines as directed by your caregiver. Do not take other medicines for constipation without talking to your caregiver first. °SEEK IMMEDIATE MEDICAL CARE IF:  °· You have bright red blood in your stool.   °· Your constipation lasts for more than 4 days or gets worse.   °· You have abdominal or rectal pain.   °· You have thin, pencil-like stools. °· You have unexplained weight loss. °MAKE SURE YOU:  °· Understand these instructions. °· Will watch your condition. °· Will get help right away if you are not doing well or get worse. °Document Released: 11/08/2003 Document Revised: 05/04/2011 Document Reviewed: 11/21/2012 °ExitCare® Patient Information ©2014 [employer], [employer]. ° °Fiber Content in Foods °Drinking plenty of fluids and consuming foods high in fiber can help with constipation. See the list below for the fiber content of some common foods. °Starches and Grains / Dietary   Fiber (g)  Cheerios, 1 cup / 3 g  Kellogg's Corn Flakes, 1 cup / 0.7 g  Rice Krispies, 1  cup / 0.3 g  Quaker Oat Life Cereal,  cup / 2.1 g  Oatmeal, instant (cooked),  cup / 2 g  Kellogg's Frosted Mini Wheats, 1 cup / 5.1 g  Rice, brown, long-grain (cooked), 1 cup / 3.5 g  Rice, white, long-grain (cooked),  1 cup / 0.6 g  Macaroni, cooked, enriched, 1 cup / 2.5 g Legumes / Dietary Fiber (g)  Beans, baked, canned, plain or vegetarian,  cup / 5.2 g  Beans, kidney, canned,  cup / 6.8 g  Beans, pinto, dried (cooked),  cup / 7.7 g  Beans, pinto, canned,  cup / 5.5 g Breads and Crackers / Dietary Fiber (g)  Graham crackers, plain or honey, 2 squares / 0.7 g  Saltine crackers, 3 squares / 0.3 g  Pretzels, plain, salted, 10 pieces / 1.8 g  Bread, whole-wheat, 1 slice / 1.9 g  Bread, white, 1 slice / 0.7 g  Bread, raisin, 1 slice / 1.2 g  Bagel, plain, 3 oz / 2 g  Tortilla, flour, 1 oz / 0.9 g  Tortilla, corn, 1 small / 1.5 g  Bun, hamburger or hotdog, 1 small / 0.9 g Fruits / Dietary Fiber (g)  Apple, raw with skin, 1 medium / 4.4 g  Applesauce, sweetened,  cup / 1.5 g  Banana,  medium / 1.5 g  Grapes, 10 grapes / 0.4 g  Orange, 1 small / 2.3 g  Raisin, 1.5 oz / 1.6 g  Melon, 1 cup / 1.4 g Vegetables / Dietary Fiber (g)  Green beans, canned,  cup / 1.3 g  Carrots (cooked),  cup / 2.3 g  Broccoli (cooked),  cup / 2.8 g  Peas, frozen (cooked),  cup / 4.4 g  Potatoes, mashed,  cup / 1.6 g  Lettuce, 1 cup / 0.5 g  Corn, canned,  cup / 1.6 g  Tomato,  cup / 1.1 g Document Released: 06/28/2006 Document Revised: 05/04/2011 Document Reviewed: 08/23/2006 ExitCare Patient Information 2014 [place].  Proctalgia Fugax Proctalgia fugax is a very short episode of intense rectal pain. It can last from seconds to minutes. It often occurs in the night, and awakens the person from sleep. It is not a sign of cancer.  CAUSES  The cause of this often intense rectal pain is not known. One possible cause may be spasm of the pelvic muscles or of the lowest part of the large intestine.  SYMPTOMS  The pain of proctalgia fugax:  Is intensely severe.  Lasts from only a few seconds to thirty minutes.  Usually awakens the person from sleep. DIAGNOSIS  In  order to make sure that there are no other problems, diagnostic tests may be done such as:   Anoscopy. This is a lighted scope that is put into the rectum to look for abnormalities.  Barium enema. X-rays are taken after administering a radio-sensitive material. TREATMENT  A number of things have been used to try to treat this condition, including:  Medications.  Warm baths.  Relaxation techniques.  Gentle massage of the painful area. HOME CARE INSTRUCTIONS   Take all medications exactly as directed.  Follow any prescribed diet.  Follow instructions regarding both rest and physical activity.  Learn progressive relaxation techniques. SEEK IMMEDIATE MEDICAL CARE IF:   Your pain does not get better in the usual amount of time.  You  develop any new symptoms. Document Released: 11/04/2000 Document Revised: 05/04/2011 Document Reviewed: 04/12/2008 [facility] Patient Information 2014 [place], [place].

## 2013-06-14 LAB — URINE CULTURE

## 2013-06-14 NOTE — ED Provider Notes (Signed)
Medical screening examination/treatment/procedure(s) were performed by non-physician practitioner and as supervising physician I was immediately available for consultation/collaboration.   EKG Interpretation None       Elah Avellino, MD 06/14/13 1437 

## 2014-01-30 ENCOUNTER — Other Ambulatory Visit: Payer: Self-pay | Admitting: Family Medicine

## 2014-01-30 DIAGNOSIS — R1310 Dysphagia, unspecified: Secondary | ICD-10-CM

## 2014-02-26 ENCOUNTER — Ambulatory Visit
Admission: RE | Admit: 2014-02-26 | Discharge: 2014-02-26 | Disposition: A | Discharge status: Home / Self Care | Payer: Medicare Other | Source: Ambulatory Visit | Attending: Family Medicine | Admitting: Family Medicine

## 2014-02-26 DIAGNOSIS — K228 Other specified diseases of esophagus: Secondary | ICD-10-CM | POA: Diagnosis not present

## 2014-02-26 DIAGNOSIS — R1310 Dysphagia, unspecified: Secondary | ICD-10-CM

## 2014-02-27 DIAGNOSIS — Z961 Presence of intraocular lens: Secondary | ICD-10-CM | POA: Diagnosis not present

## 2014-02-27 DIAGNOSIS — H4011X3 Primary open-angle glaucoma, severe stage: Secondary | ICD-10-CM | POA: Diagnosis not present

## 2014-03-12 DIAGNOSIS — J069 Acute upper respiratory infection, unspecified: Secondary | ICD-10-CM | POA: Diagnosis not present

## 2014-03-12 DIAGNOSIS — R4702 Dysphasia: Secondary | ICD-10-CM | POA: Diagnosis not present

## 2014-05-17 DIAGNOSIS — Z0000 Encounter for general adult medical examination without abnormal findings: Secondary | ICD-10-CM | POA: Diagnosis not present

## 2014-06-15 DIAGNOSIS — I10 Essential (primary) hypertension: Secondary | ICD-10-CM | POA: Diagnosis not present

## 2014-06-15 DIAGNOSIS — N3281 Overactive bladder: Secondary | ICD-10-CM | POA: Diagnosis not present

## 2014-06-28 DIAGNOSIS — Z961 Presence of intraocular lens: Secondary | ICD-10-CM | POA: Diagnosis not present

## 2014-06-28 DIAGNOSIS — H4011X3 Primary open-angle glaucoma, severe stage: Secondary | ICD-10-CM | POA: Diagnosis not present

## 2014-07-05 DIAGNOSIS — H4011X3 Primary open-angle glaucoma, severe stage: Secondary | ICD-10-CM | POA: Diagnosis not present

## 2014-07-18 DIAGNOSIS — N3281 Overactive bladder: Secondary | ICD-10-CM | POA: Diagnosis not present

## 2014-07-18 DIAGNOSIS — I10 Essential (primary) hypertension: Secondary | ICD-10-CM | POA: Diagnosis not present

## 2014-07-26 DIAGNOSIS — H4011X3 Primary open-angle glaucoma, severe stage: Secondary | ICD-10-CM | POA: Diagnosis not present

## 2014-08-17 DIAGNOSIS — N3281 Overactive bladder: Secondary | ICD-10-CM | POA: Diagnosis not present

## 2014-08-17 DIAGNOSIS — K432 Incisional hernia without obstruction or gangrene: Secondary | ICD-10-CM | POA: Diagnosis not present

## 2014-08-17 DIAGNOSIS — I10 Essential (primary) hypertension: Secondary | ICD-10-CM | POA: Diagnosis not present

## 2014-11-28 DIAGNOSIS — Z961 Presence of intraocular lens: Secondary | ICD-10-CM | POA: Diagnosis not present

## 2014-11-28 DIAGNOSIS — H353112 Nonexudative age-related macular degeneration, right eye, intermediate dry stage: Secondary | ICD-10-CM | POA: Diagnosis not present

## 2014-11-28 DIAGNOSIS — H401113 Primary open-angle glaucoma, right eye, severe stage: Secondary | ICD-10-CM | POA: Diagnosis not present

## 2014-12-17 DIAGNOSIS — Z23 Encounter for immunization: Secondary | ICD-10-CM | POA: Diagnosis not present

## 2014-12-17 DIAGNOSIS — Z6826 Body mass index (BMI) 26.0-26.9, adult: Secondary | ICD-10-CM | POA: Diagnosis not present

## 2014-12-17 DIAGNOSIS — N3281 Overactive bladder: Secondary | ICD-10-CM | POA: Diagnosis not present

## 2014-12-17 DIAGNOSIS — I10 Essential (primary) hypertension: Secondary | ICD-10-CM | POA: Diagnosis not present

## 2015-04-04 DIAGNOSIS — H401133 Primary open-angle glaucoma, bilateral, severe stage: Secondary | ICD-10-CM | POA: Diagnosis not present

## 2015-04-04 DIAGNOSIS — Z961 Presence of intraocular lens: Secondary | ICD-10-CM | POA: Diagnosis not present

## 2015-04-22 DIAGNOSIS — I10 Essential (primary) hypertension: Secondary | ICD-10-CM | POA: Diagnosis not present

## 2015-04-22 DIAGNOSIS — R413 Other amnesia: Secondary | ICD-10-CM | POA: Diagnosis not present

## 2015-04-22 DIAGNOSIS — M130 Polyarthritis, unspecified: Secondary | ICD-10-CM | POA: Diagnosis not present

## 2015-05-02 DIAGNOSIS — K5900 Constipation, unspecified: Secondary | ICD-10-CM | POA: Diagnosis not present

## 2015-05-14 DIAGNOSIS — R42 Dizziness and giddiness: Secondary | ICD-10-CM | POA: Diagnosis not present

## 2015-06-06 DIAGNOSIS — N3281 Overactive bladder: Secondary | ICD-10-CM | POA: Diagnosis not present

## 2015-06-06 DIAGNOSIS — R42 Dizziness and giddiness: Secondary | ICD-10-CM | POA: Diagnosis not present

## 2015-06-06 DIAGNOSIS — M130 Polyarthritis, unspecified: Secondary | ICD-10-CM | POA: Diagnosis not present

## 2015-06-06 DIAGNOSIS — I10 Essential (primary) hypertension: Secondary | ICD-10-CM | POA: Diagnosis not present

## 2015-07-11 DIAGNOSIS — I10 Essential (primary) hypertension: Secondary | ICD-10-CM | POA: Diagnosis not present

## 2015-07-11 DIAGNOSIS — M130 Polyarthritis, unspecified: Secondary | ICD-10-CM | POA: Diagnosis not present

## 2015-07-11 DIAGNOSIS — N3281 Overactive bladder: Secondary | ICD-10-CM | POA: Diagnosis not present

## 2015-07-11 DIAGNOSIS — Z0000 Encounter for general adult medical examination without abnormal findings: Secondary | ICD-10-CM | POA: Diagnosis not present

## 2015-08-13 DIAGNOSIS — H02403 Unspecified ptosis of bilateral eyelids: Secondary | ICD-10-CM | POA: Diagnosis not present

## 2015-08-13 DIAGNOSIS — H401133 Primary open-angle glaucoma, bilateral, severe stage: Secondary | ICD-10-CM | POA: Diagnosis not present

## 2015-08-13 DIAGNOSIS — Z961 Presence of intraocular lens: Secondary | ICD-10-CM | POA: Diagnosis not present

## 2015-09-04 DIAGNOSIS — M130 Polyarthritis, unspecified: Secondary | ICD-10-CM | POA: Diagnosis not present

## 2015-09-04 DIAGNOSIS — I10 Essential (primary) hypertension: Secondary | ICD-10-CM | POA: Diagnosis not present

## 2015-09-04 DIAGNOSIS — N3281 Overactive bladder: Secondary | ICD-10-CM | POA: Diagnosis not present

## 2015-11-11 DIAGNOSIS — I10 Essential (primary) hypertension: Secondary | ICD-10-CM | POA: Diagnosis not present

## 2015-11-11 DIAGNOSIS — N3281 Overactive bladder: Secondary | ICD-10-CM | POA: Diagnosis not present

## 2016-01-22 DIAGNOSIS — K432 Incisional hernia without obstruction or gangrene: Secondary | ICD-10-CM | POA: Diagnosis not present

## 2016-01-22 DIAGNOSIS — I10 Essential (primary) hypertension: Secondary | ICD-10-CM | POA: Diagnosis not present

## 2016-01-22 DIAGNOSIS — M130 Polyarthritis, unspecified: Secondary | ICD-10-CM | POA: Diagnosis not present

## 2016-02-20 DIAGNOSIS — H02403 Unspecified ptosis of bilateral eyelids: Secondary | ICD-10-CM | POA: Diagnosis not present

## 2016-02-20 DIAGNOSIS — H401133 Primary open-angle glaucoma, bilateral, severe stage: Secondary | ICD-10-CM | POA: Diagnosis not present

## 2016-02-20 DIAGNOSIS — Z961 Presence of intraocular lens: Secondary | ICD-10-CM | POA: Diagnosis not present

## 2016-03-31 DIAGNOSIS — M130 Polyarthritis, unspecified: Secondary | ICD-10-CM | POA: Diagnosis not present

## 2016-03-31 DIAGNOSIS — I10 Essential (primary) hypertension: Secondary | ICD-10-CM | POA: Diagnosis not present

## 2016-03-31 DIAGNOSIS — N3281 Overactive bladder: Secondary | ICD-10-CM | POA: Diagnosis not present

## 2016-03-31 DIAGNOSIS — J00 Acute nasopharyngitis [common cold]: Secondary | ICD-10-CM | POA: Diagnosis not present

## 2016-06-30 DIAGNOSIS — H900 Conductive hearing loss, bilateral: Secondary | ICD-10-CM | POA: Diagnosis not present

## 2016-06-30 DIAGNOSIS — N3281 Overactive bladder: Secondary | ICD-10-CM | POA: Diagnosis not present

## 2016-08-04 DIAGNOSIS — Z961 Presence of intraocular lens: Secondary | ICD-10-CM | POA: Diagnosis not present

## 2016-08-04 DIAGNOSIS — H401133 Primary open-angle glaucoma, bilateral, severe stage: Secondary | ICD-10-CM | POA: Diagnosis not present

## 2016-09-08 DIAGNOSIS — M130 Polyarthritis, unspecified: Secondary | ICD-10-CM | POA: Diagnosis not present

## 2016-09-08 DIAGNOSIS — I10 Essential (primary) hypertension: Secondary | ICD-10-CM | POA: Diagnosis not present

## 2016-12-08 DIAGNOSIS — Z23 Encounter for immunization: Secondary | ICD-10-CM | POA: Diagnosis not present

## 2016-12-08 DIAGNOSIS — I10 Essential (primary) hypertension: Secondary | ICD-10-CM | POA: Diagnosis not present

## 2017-04-02 DIAGNOSIS — H401133 Primary open-angle glaucoma, bilateral, severe stage: Secondary | ICD-10-CM | POA: Diagnosis not present

## 2017-06-03 DIAGNOSIS — E039 Hypothyroidism, unspecified: Secondary | ICD-10-CM | POA: Diagnosis not present

## 2017-06-03 DIAGNOSIS — I10 Essential (primary) hypertension: Secondary | ICD-10-CM | POA: Diagnosis not present

## 2017-06-06 ENCOUNTER — Encounter (HOSPITAL_COMMUNITY): Payer: Self-pay | Admitting: Emergency Medicine

## 2017-06-06 ENCOUNTER — Emergency Department (HOSPITAL_COMMUNITY): Payer: Medicare Other

## 2017-06-06 ENCOUNTER — Other Ambulatory Visit: Payer: Self-pay

## 2017-06-06 ENCOUNTER — Emergency Department (HOSPITAL_COMMUNITY)
Admission: EM | Admit: 2017-06-06 | Discharge: 2017-06-06 | Disposition: A | Discharge status: Home / Self Care | Payer: Medicare Other | Attending: Emergency Medicine | Admitting: Emergency Medicine

## 2017-06-06 DIAGNOSIS — M25559 Pain in unspecified hip: Secondary | ICD-10-CM | POA: Diagnosis not present

## 2017-06-06 DIAGNOSIS — M25551 Pain in right hip: Secondary | ICD-10-CM | POA: Diagnosis not present

## 2017-06-06 DIAGNOSIS — W19XXXA Unspecified fall, initial encounter: Secondary | ICD-10-CM

## 2017-06-06 DIAGNOSIS — I10 Essential (primary) hypertension: Secondary | ICD-10-CM | POA: Insufficient documentation

## 2017-06-06 DIAGNOSIS — S299XXA Unspecified injury of thorax, initial encounter: Secondary | ICD-10-CM | POA: Diagnosis not present

## 2017-06-06 DIAGNOSIS — S79911A Unspecified injury of right hip, initial encounter: Secondary | ICD-10-CM | POA: Diagnosis not present

## 2017-06-06 DIAGNOSIS — T148XXA Other injury of unspecified body region, initial encounter: Secondary | ICD-10-CM | POA: Diagnosis not present

## 2017-06-06 LAB — URINALYSIS, ROUTINE W REFLEX MICROSCOPIC
Bilirubin Urine: NEGATIVE
Glucose, UA: NEGATIVE mg/dL
Hgb urine dipstick: NEGATIVE
Ketones, ur: NEGATIVE mg/dL
LEUKOCYTES UA: NEGATIVE
NITRITE: NEGATIVE
Protein, ur: NEGATIVE mg/dL
SPECIFIC GRAVITY, URINE: 1.014 (ref 1.005–1.030)
pH: 9 — ABNORMAL HIGH (ref 5.0–8.0)

## 2017-06-06 LAB — I-STAT TROPONIN, ED: Troponin i, poc: 0.03 ng/mL (ref 0.00–0.08)

## 2017-06-06 MED ORDER — FENTANYL CITRATE (PF) 100 MCG/2ML IJ SOLN
25.0000 ug | Freq: Once | INTRAMUSCULAR | Status: AC
  Administered 2017-06-06: 25 ug via INTRAVENOUS
  Filled 2017-06-06 (×2): qty 2

## 2017-06-06 MED ORDER — LIDOCAINE 5 % EX PTCH: 1.0000 | MEDICATED_PATCH | CUTANEOUS | 0 refills | Status: AC

## 2017-06-06 NOTE — ED Notes (Signed)
Ambulated pt in hall and was able to walk approximately 20 feet.  Pt was a 2 person assist.

## 2017-06-06 NOTE — ED Triage Notes (Signed)
Per [facility]: pt from home with c/o a witnessed mechanical fall that happened around 1900 last evening.  Pt denies LOC.  No reported neck, back, or head pain.  Pt states pain in lower extremities bilaterally.  No neuro deficits.  No blood thinners.  PTA vitals: 154/84, HR 102, RR 18, 97% RA.

## 2017-06-06 NOTE — ED Notes (Signed)
Case management [facility] and [facility].

## 2017-06-06 NOTE — ED Notes (Signed)
IV access attempted X2 without success. 

## 2017-06-06 NOTE — Discharge Instructions (Signed)
We believe that your symptoms are caused by musculoskeletal strain.  Please read through the included information about additional care such as heating pads, over-the-counter pain medicine.  If you were provided a prescription please use it only as needed and as instructed.  Remember that early mobility and using the affected part of your body is actually better than keeping it immobile.  If the lidoderm patch is too expensive you can buy a cheaper over-the-counter patch.   Follow-up with the doctor listed as recommended or return to the emergency department with new or worsening symptoms that concern you.

## 2017-06-06 NOTE — Care Management Note (Signed)
Case Management Note  Patient Details  Name: Peter CongoMaggie L Parmelee MRN: [id number] Date of Birth: 09-Apr-1921  Subjective/Objective:                 Spoke w patient's son in the hallway. [facility] Patient has DME RW. No other CM needs.    Action/Plan:   Expected Discharge Date:                  Expected Discharge Plan:  Home w Home Health Services  In-House Referral:     Discharge planning Services  CM Consult  Post Acute Care Choice:  Home Health Choice offered to:  Adult Children  DME Arranged:    DME Agency:     HH Arranged:  RN, PT, OT, Nurse's Aide [facility]  Status of Service:  Completed, signed off  If discussed at Long Length of Stay Meetings, dates discussed:    Additional Comments:  Lawerance SabalDebbie Laurian Edrington, RN 06/06/2017, 3:37 PM

## 2017-06-06 NOTE — ED Notes (Signed)
Patient transported to X-ray 

## 2017-06-06 NOTE — ED Notes (Signed)
Pt now stating bilateral hip and lower back pain.

## 2017-06-06 NOTE — ED Provider Notes (Signed)
Emergency Department Provider Note   I have reviewed the triage vital signs and the nursing notes.   HISTORY  Chief Complaint Fall   HPI Sydney Mason is a 84 y.o. female with PMH of arthritis and HTN presents to the emergency department for evaluation after mechanical fall.  The patient fell at home last night where she lives with her son.  Her son immediately helped her to her feet and then had to assist her into bed.  He states he had to get up throughout the night to help her to the bathroom but if he was supporting her she could walk there.  She continued to have pain this morning so they presented to the emergency department.  Patient denies any numbness or weakness in the leg.  She denies any chest pain, palpitations, shortness of breath.  No abdominal pain.    Past Medical History:  Diagnosis Date  . Arthritis   . Hypertension     Patient Active Problem List   Diagnosis Date Noted  . Abdominal pain, intermittent, right lower quadrant 07/29/2011    Past Surgical History:  Procedure Laterality Date  . ABDOMINAL HYSTERECTOMY    . APPENDECTOMY    . HERNIA REPAIR      Current Outpatient Rx  . Order #: [id number] Class: Historical Med  . Order #: [id number] Class: Historical Med  . Order #: [id number] Class: Historical Med  . Order #: [id number] Class: Historical Med  . Order #: [id number] Class: Print  . Order #: [id number] Class: Historical Med  . Order #: [id number] Class: Print  . Order #: [id number] Class: Print    Allergies Patient has no known allergies.  History reviewed. No pertinent family history.  Social History Social History   Tobacco Use  . Smoking status: Never Smoker  . Smokeless tobacco: Never Used  Substance Use Topics  . Alcohol use: No  . Drug use: No    Review of Systems  Constitutional: No fever/chills Eyes: No visual changes. ENT: No sore throat. Cardiovascular: Denies chest pain. Respiratory: Denies shortness of  breath. Gastrointestinal: No abdominal pain.  No nausea, no vomiting.  No diarrhea.  No constipation. Genitourinary: Negative for dysuria. Musculoskeletal: Negative for back pain. Positive right hip pain.  Skin: Negative for rash. Neurological: Negative for headaches, focal weakness or numbness.  10-point ROS otherwise negative.  ____________________________________________   PHYSICAL EXAM:  VITAL SIGNS: ED Triage Vitals  Enc Vitals Group     BP 06/06/17 0915 (!) 151/82     Pulse Rate 06/06/17 0915 97     Resp 06/06/17 0915 18     Temp 06/06/17 0915 100 F (37.8 C)     Temp Source 06/06/17 0915 Oral     SpO2 06/06/17 0915 94 %     Pain Score 06/06/17 0921 4   Constitutional: Alert and oriented. Well appearing and in no acute distress. Eyes: Conjunctivae are normal. PERRL.  Head: Atraumatic. Nose: No congestion/rhinnorhea. Mouth/Throat: Mucous membranes are moist.  Neck: No stridor. No cervical spine tenderness to palpation. Cardiovascular: Normal rate, regular rhythm. Good peripheral circulation. Grossly normal heart sounds.   Respiratory: Normal respiratory effort.  No retractions. Lungs CTAB. Gastrointestinal: Soft and nontender. No distention.  Musculoskeletal: No lower extremity edema. No gross deformities of extremities. Mild pain with passive ROM of the right hip.  Neurologic:  Normal speech and language. No gross focal neurologic deficits are appreciated.  Skin:  Skin is warm, dry and intact. No rash noted.  ____________________________________________   LABS (all labs ordered  are listed, but only abnormal results are displayed)  Labs Reviewed  URINALYSIS, ROUTINE W REFLEX MICROSCOPIC - Abnormal; Notable for the following components:      Result Value   APPearance CLOUDY (*)    pH 9.0 (*)    All other components within normal limits  I-STAT TROPONIN, ED   ____________________________________________  RADIOLOGY  Dg Chest 2 View  Result Date:  06/06/2017 CLINICAL DATA:  RIGHT hip pain after fall. EXAM: CHEST - 2 VIEW COMPARISON:  Chest x-ray dated Aug 25, 202012. FINDINGS: Cardiomegaly. Bilateral new bilateral interstitial prominence, presumed interstitial edema related to CHF/volume overload, alternatively interval development of chronic interstitial fibrosis. No pleural effusion or pneumothorax seen. Stable kyphosis of the thoracic spine. No acute or suspicious osseous finding. IMPRESSION: 1. New bilateral interstitial prominence. This could represent acute interstitial edema related to CHF/volume overload or interval development of chronic interstitial lung disease/fibrosis. 2. Stable cardiomegaly. Electronically Signed   By: Bary Richard M.D.   On: 06/06/2017 10:54   Dg Hip Unilat W Or Wo Pelvis 2-3 Views Right  Result Date: 06/06/2017 CLINICAL DATA:  RIGHT hip pain after falling last night. EXAM: DG HIP (WITH OR WITHOUT PELVIS) 2-3V RIGHT COMPARISON:  None. FINDINGS: Single view of the pelvis and two views of the RIGHT hip are provided. Osseous alignment is normal. No fracture line or displaced fracture fragment seen. No significant degenerative change at either hip joint. Soft tissues about the pelvis and RIGHT hip are unremarkable. IMPRESSION: Negative. Electronically Signed   By: Bary Richard M.D.   On: 06/06/2017 10:07    ____________________________________________   PROCEDURES  Procedure(s) performed:   Procedures  None ____________________________________________   INITIAL IMPRESSION / ASSESSMENT AND PLAN / ED COURSE  Pertinent labs & imaging results that were available during my care of the patient were reviewed by me and considered in my medical decision making (see chart for details).  Patient presents to the emergency department for evaluation of mechanical fall.  She has an internally rotated right hip with palpable pulses and normal sensation.  Plan for plain film of the hip along with chest x-ray.  Will establish  IV access for pain control.  Patient's plain film is negative. CXR with no traumatic findings. No clinical evidence of volume overload on exam. No evidence of PNA. Family is working with PCP regarding gradual weight loss and decline. Not suddenly worse today. Troponin and UA are negative. Unable to obtain additional labs and patient/family are asking about discharge. She has ambulated in the ED. Ordered home health evaluation and advised f/u with the PCP in the AM. Patient to try lidocaine patch for pain.   At this time, I do not feel there is any life-threatening condition present. I have reviewed and discussed all results (EKG, imaging, lab, urine as appropriate), exam findings with patient. I have reviewed nursing notes and appropriate previous records.  I feel the patient is safe to be discharged home without further emergent workup. Discussed usual and customary return precautions. Patient and family (if present) verbalize understanding and are comfortable with this plan.  Patient will follow-up with their primary care provider. If they do not have a primary care provider, information for follow-up has been provided to them. All questions have been answered.  ____________________________________________  FINAL CLINICAL IMPRESSION(S) / ED DIAGNOSES  Final diagnoses:  Fall, initial encounter  Right hip pain     MEDICATIONS GIVEN DURING THIS VISIT:  Medications  fentaNYL (SUBLIMAZE) injection 25 mcg (25 mcg Intravenous Given 06/06/17  1214)     NEW OUTPATIENT MEDICATIONS STARTED DURING THIS VISIT:  Discharge Medication List as of 06/06/2017  3:04 PM    START taking these medications   Details  lidocaine (LIDODERM) 5 % Place 1 patch onto the skin daily. Remove & Discard patch within 12 hours or as directed by MD, Starting Sun 06/06/2017, Print        Note:  This document was prepared using Dragon voice recognition software and may include unintentional dictation errors.  Alona Bene,  MD Emergency Medicine    Long, Arlyss Repress, MD 06/06/17 [phone]

## 2017-06-06 NOTE — ED Notes (Signed)
IV Team at bedside 

## 2017-06-06 NOTE — ED Notes (Signed)
Called Dr. Jacqulyn BathLong re: uncollected labs, order to d/c lab orders.  Case management at bedside to set up home health services.

## 2017-06-08 DIAGNOSIS — I509 Heart failure, unspecified: Secondary | ICD-10-CM | POA: Diagnosis not present

## 2017-06-08 DIAGNOSIS — M170 Bilateral primary osteoarthritis of knee: Secondary | ICD-10-CM | POA: Diagnosis not present

## 2017-06-08 DIAGNOSIS — I110 Hypertensive heart disease with heart failure: Secondary | ICD-10-CM | POA: Diagnosis not present

## 2017-06-08 DIAGNOSIS — M40204 Unspecified kyphosis, thoracic region: Secondary | ICD-10-CM | POA: Diagnosis not present

## 2017-06-08 DIAGNOSIS — M25551 Pain in right hip: Secondary | ICD-10-CM | POA: Diagnosis not present

## 2017-06-17 DIAGNOSIS — I110 Hypertensive heart disease with heart failure: Secondary | ICD-10-CM | POA: Diagnosis not present

## 2017-06-17 DIAGNOSIS — M25551 Pain in right hip: Secondary | ICD-10-CM | POA: Diagnosis not present

## 2017-06-17 DIAGNOSIS — M170 Bilateral primary osteoarthritis of knee: Secondary | ICD-10-CM | POA: Diagnosis not present

## 2017-06-17 DIAGNOSIS — M40204 Unspecified kyphosis, thoracic region: Secondary | ICD-10-CM | POA: Diagnosis not present

## 2017-06-17 DIAGNOSIS — I509 Heart failure, unspecified: Secondary | ICD-10-CM | POA: Diagnosis not present

## 2017-06-22 DIAGNOSIS — M170 Bilateral primary osteoarthritis of knee: Secondary | ICD-10-CM | POA: Diagnosis not present

## 2017-06-22 DIAGNOSIS — M40204 Unspecified kyphosis, thoracic region: Secondary | ICD-10-CM | POA: Diagnosis not present

## 2017-06-22 DIAGNOSIS — I110 Hypertensive heart disease with heart failure: Secondary | ICD-10-CM | POA: Diagnosis not present

## 2017-06-22 DIAGNOSIS — M25551 Pain in right hip: Secondary | ICD-10-CM | POA: Diagnosis not present

## 2017-06-22 DIAGNOSIS — I509 Heart failure, unspecified: Secondary | ICD-10-CM | POA: Diagnosis not present

## 2017-06-28 DIAGNOSIS — M40204 Unspecified kyphosis, thoracic region: Secondary | ICD-10-CM | POA: Diagnosis not present

## 2017-06-28 DIAGNOSIS — M170 Bilateral primary osteoarthritis of knee: Secondary | ICD-10-CM | POA: Diagnosis not present

## 2017-06-28 DIAGNOSIS — I509 Heart failure, unspecified: Secondary | ICD-10-CM | POA: Diagnosis not present

## 2017-06-28 DIAGNOSIS — M25551 Pain in right hip: Secondary | ICD-10-CM | POA: Diagnosis not present

## 2017-06-28 DIAGNOSIS — I110 Hypertensive heart disease with heart failure: Secondary | ICD-10-CM | POA: Diagnosis not present

## 2017-07-01 DIAGNOSIS — M170 Bilateral primary osteoarthritis of knee: Secondary | ICD-10-CM | POA: Diagnosis not present

## 2017-07-01 DIAGNOSIS — M40204 Unspecified kyphosis, thoracic region: Secondary | ICD-10-CM | POA: Diagnosis not present

## 2017-07-01 DIAGNOSIS — I509 Heart failure, unspecified: Secondary | ICD-10-CM | POA: Diagnosis not present

## 2017-07-01 DIAGNOSIS — I110 Hypertensive heart disease with heart failure: Secondary | ICD-10-CM | POA: Diagnosis not present

## 2017-07-01 DIAGNOSIS — M25551 Pain in right hip: Secondary | ICD-10-CM | POA: Diagnosis not present

## 2017-07-02 DIAGNOSIS — I509 Heart failure, unspecified: Secondary | ICD-10-CM | POA: Diagnosis not present

## 2017-07-02 DIAGNOSIS — M170 Bilateral primary osteoarthritis of knee: Secondary | ICD-10-CM | POA: Diagnosis not present

## 2017-07-02 DIAGNOSIS — I110 Hypertensive heart disease with heart failure: Secondary | ICD-10-CM | POA: Diagnosis not present

## 2017-07-02 DIAGNOSIS — M25551 Pain in right hip: Secondary | ICD-10-CM | POA: Diagnosis not present

## 2017-07-02 DIAGNOSIS — M40204 Unspecified kyphosis, thoracic region: Secondary | ICD-10-CM | POA: Diagnosis not present

## 2018-03-26 DEATH — deceased

## 2019-09-03 IMAGING — DX DG CHEST 2V
2 series · 2 of 2 positions shown · non-contrast
Comparison: Chest x-ray dated 02/27/2010.

CLINICAL DATA: RIGHT hip pain after fall.

EXAM:
CHEST - 2 VIEW

[chest ap]
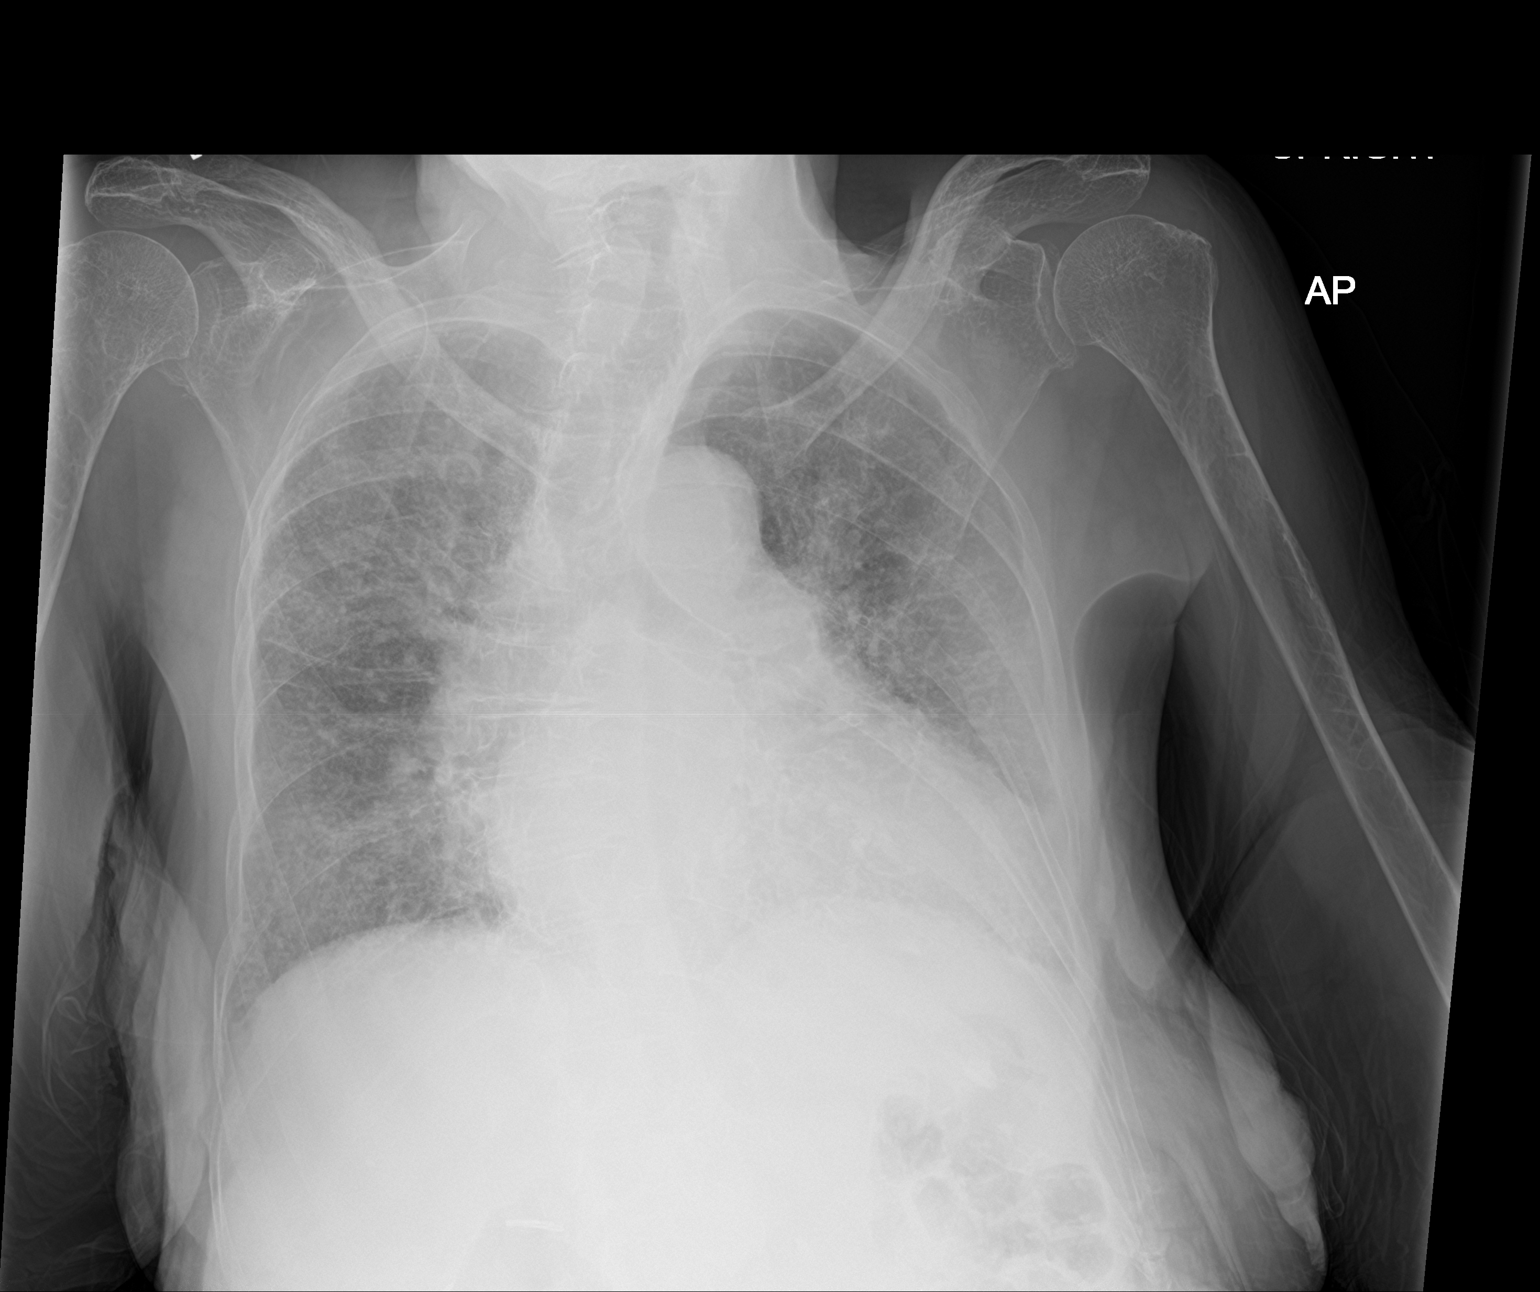

[chest lat]
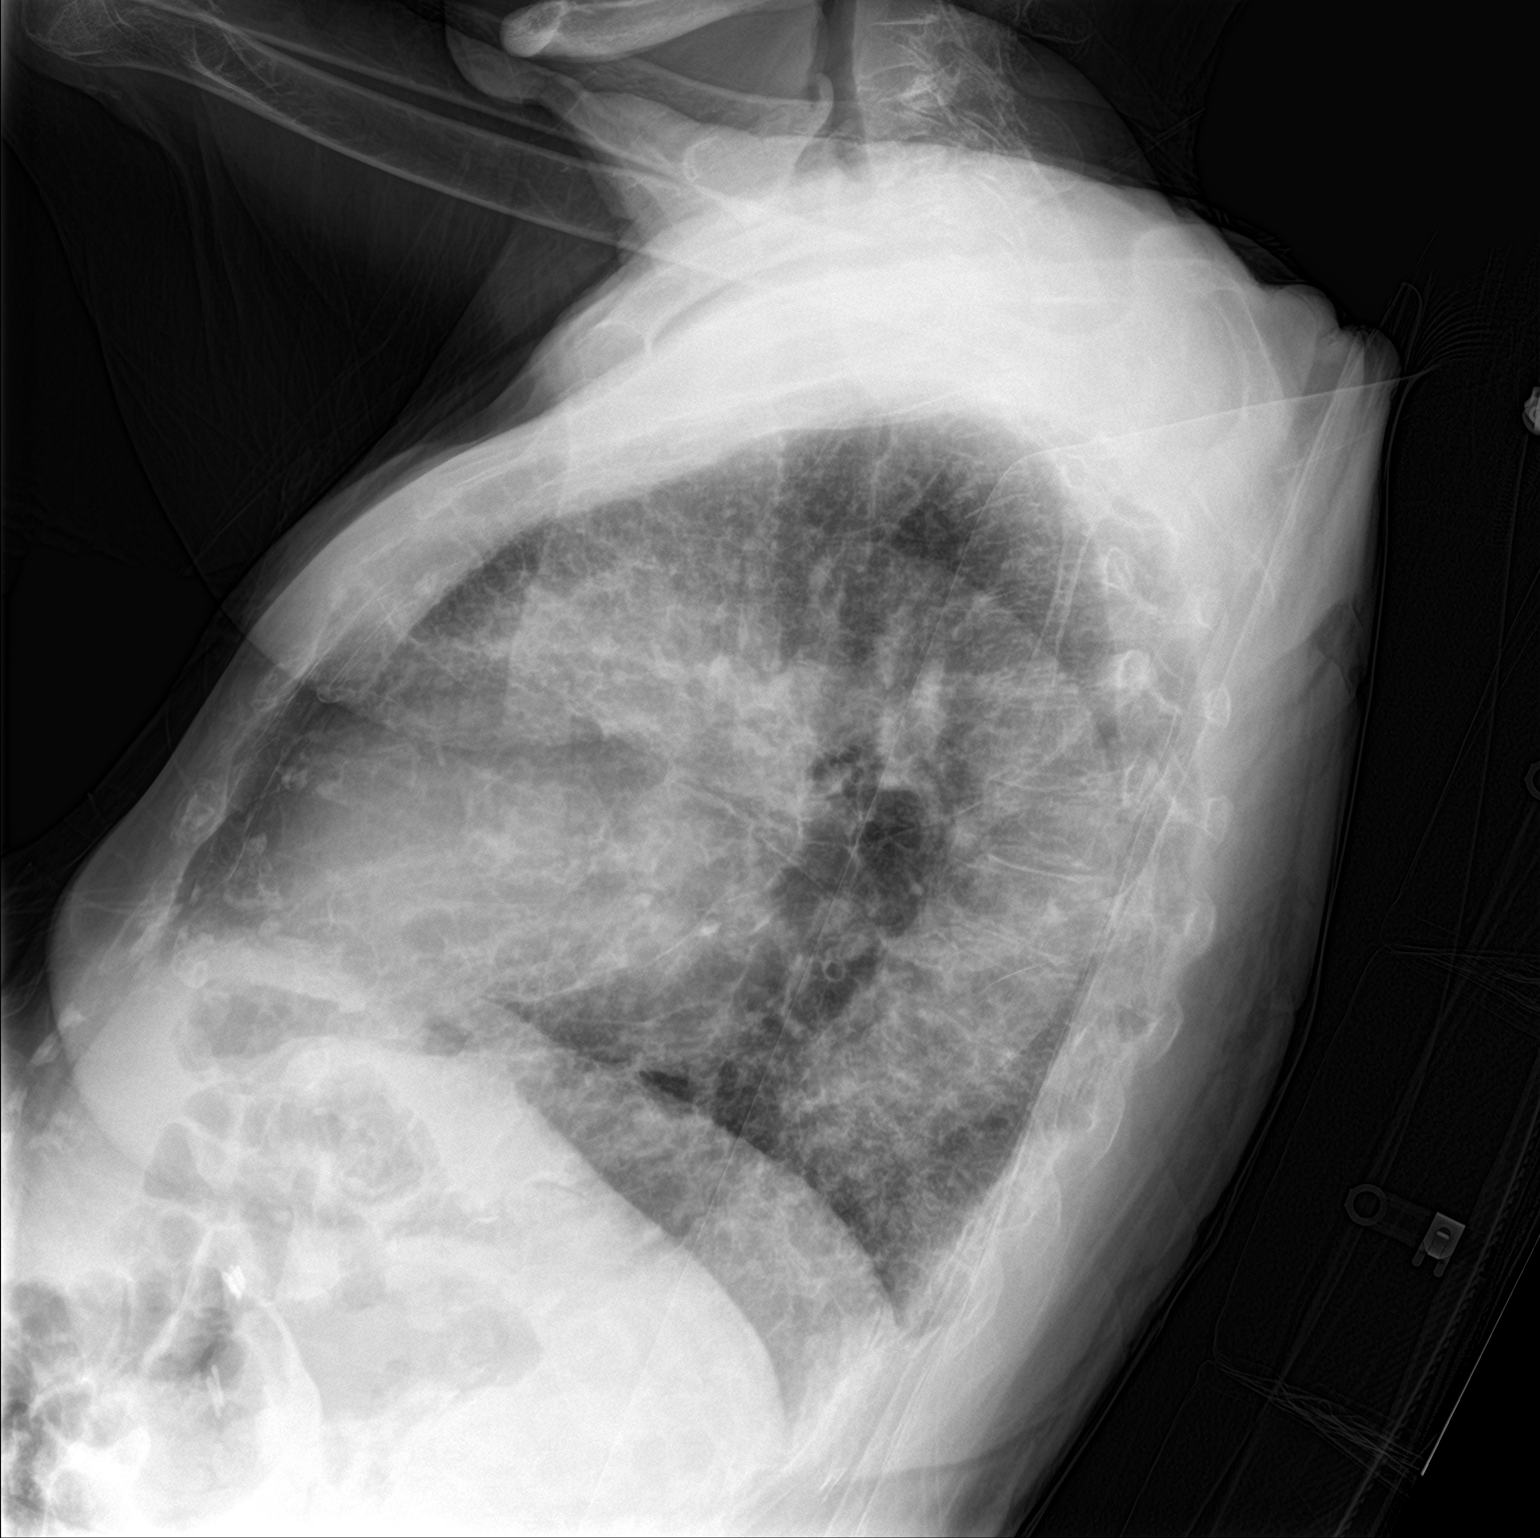

[2 of 2 positions shown; findings below may reference images not displayed]

FINDINGS: Cardiomegaly. Bilateral new bilateral interstitial prominence,
presumed interstitial edema related to CHF/volume overload,
alternatively interval development of chronic interstitial fibrosis.
No pleural effusion or pneumothorax seen.

Stable kyphosis of the thoracic spine. No acute or suspicious
osseous finding.
IMPRESSION: 1. New bilateral interstitial prominence. This could represent acute
interstitial edema related to CHF/volume overload or interval
development of chronic interstitial lung disease/fibrosis.
2. Stable cardiomegaly.

## 2019-09-03 IMAGING — DX DG HIP (WITH OR WITHOUT PELVIS) 2-3V*R*
3 series · 3 of 3 positions shown · non-contrast
Comparison: None.

CLINICAL DATA: RIGHT hip pain after falling last night.

EXAM:
DG HIP (WITH OR WITHOUT PELVIS) 2-3V RIGHT

[hip ap]
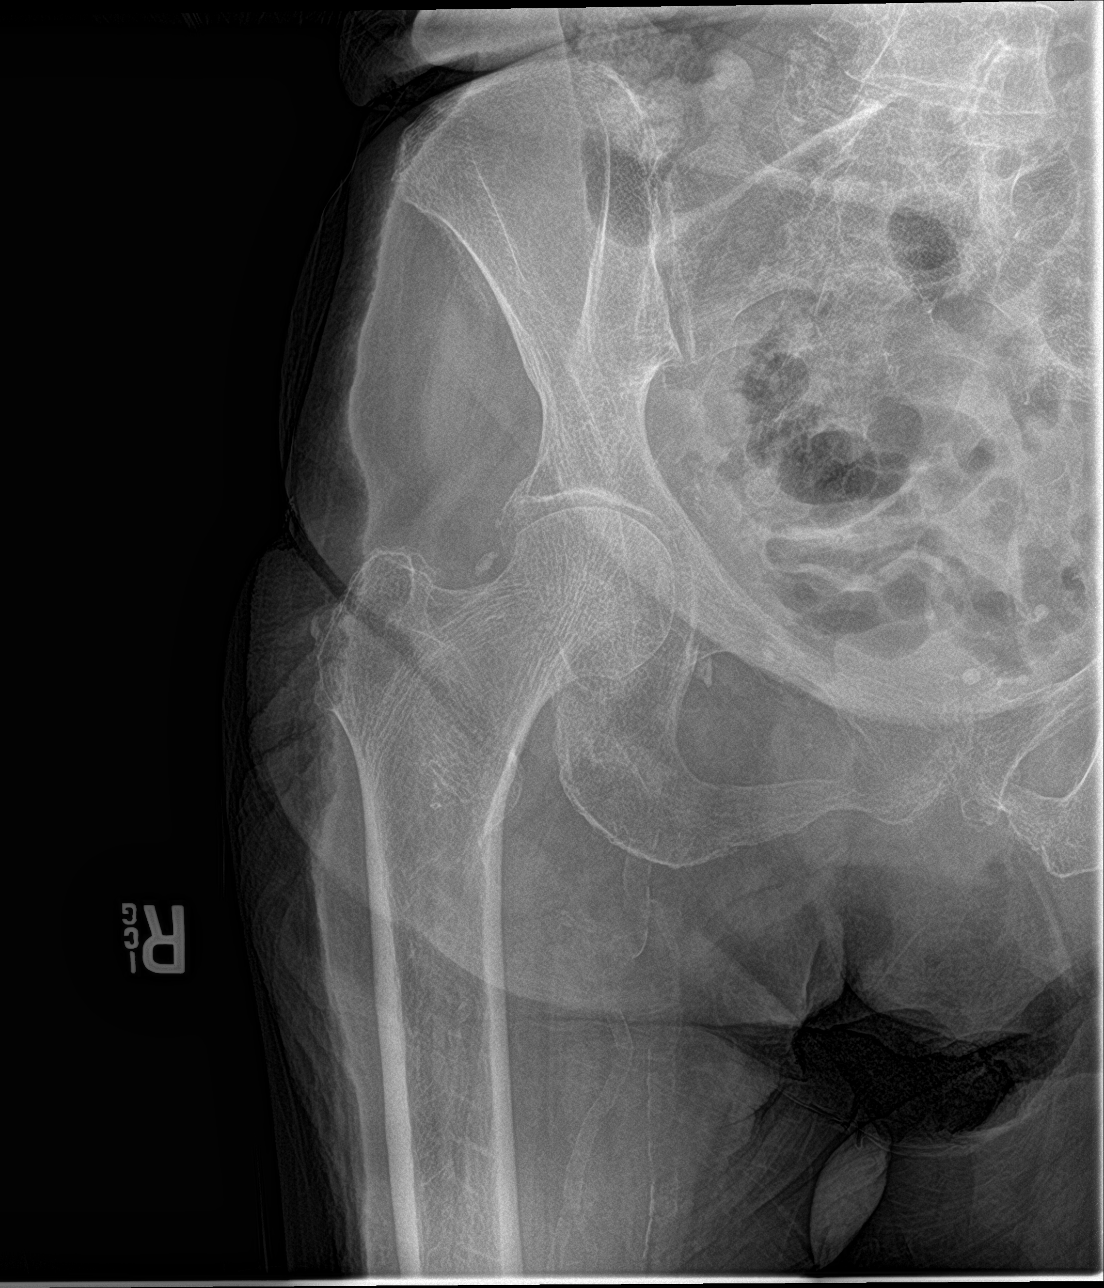

[hip lat]
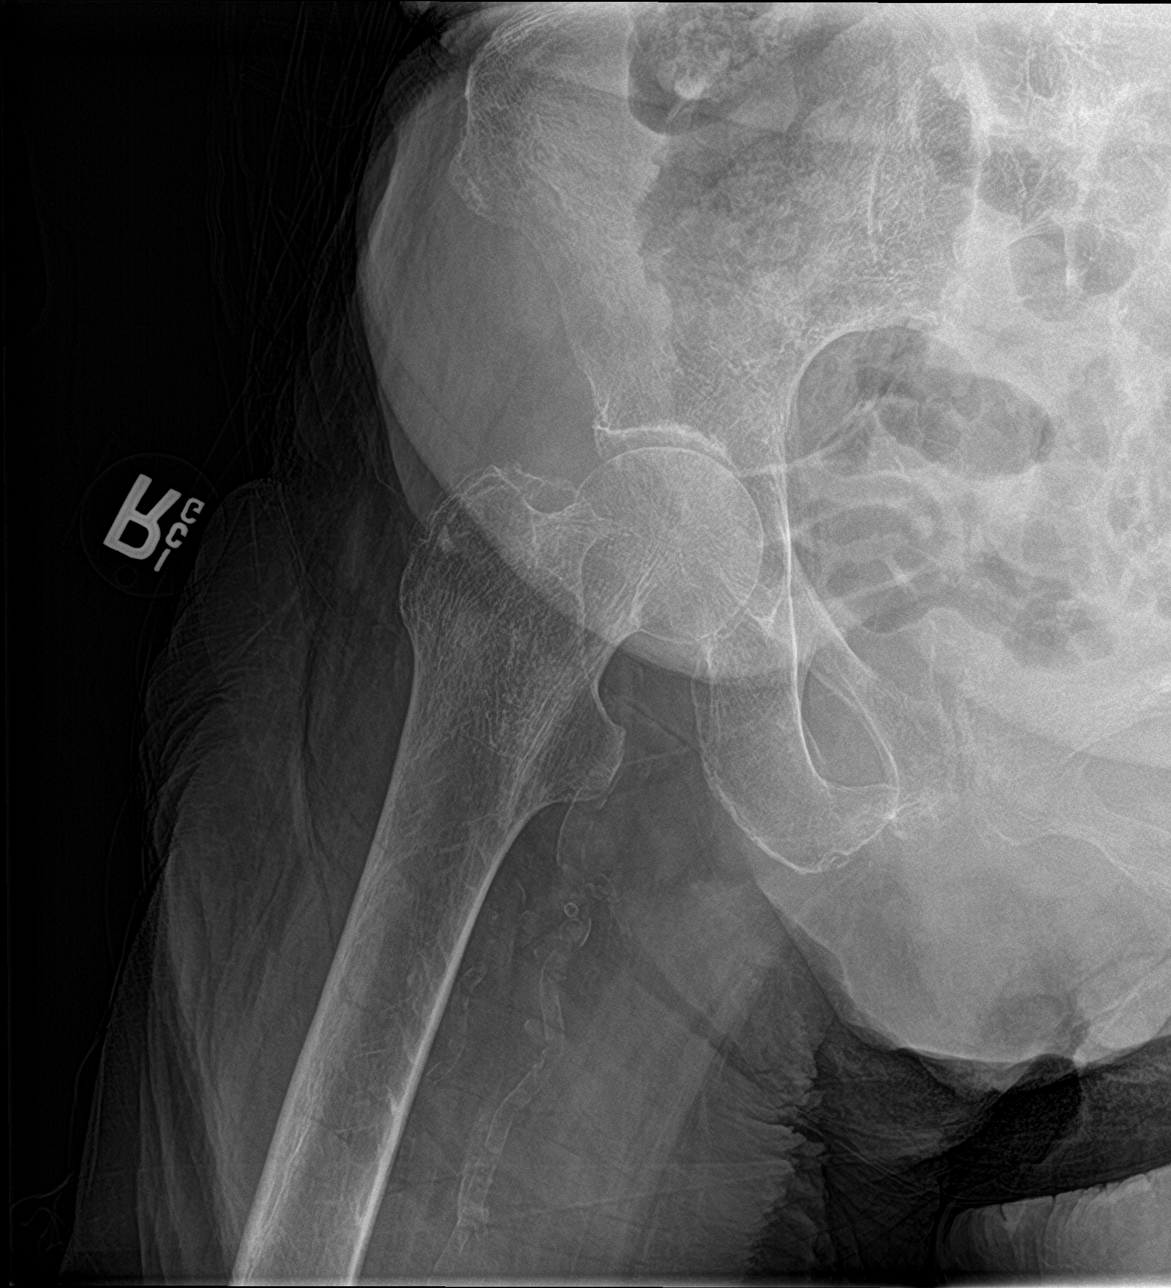

[pelvis ap]
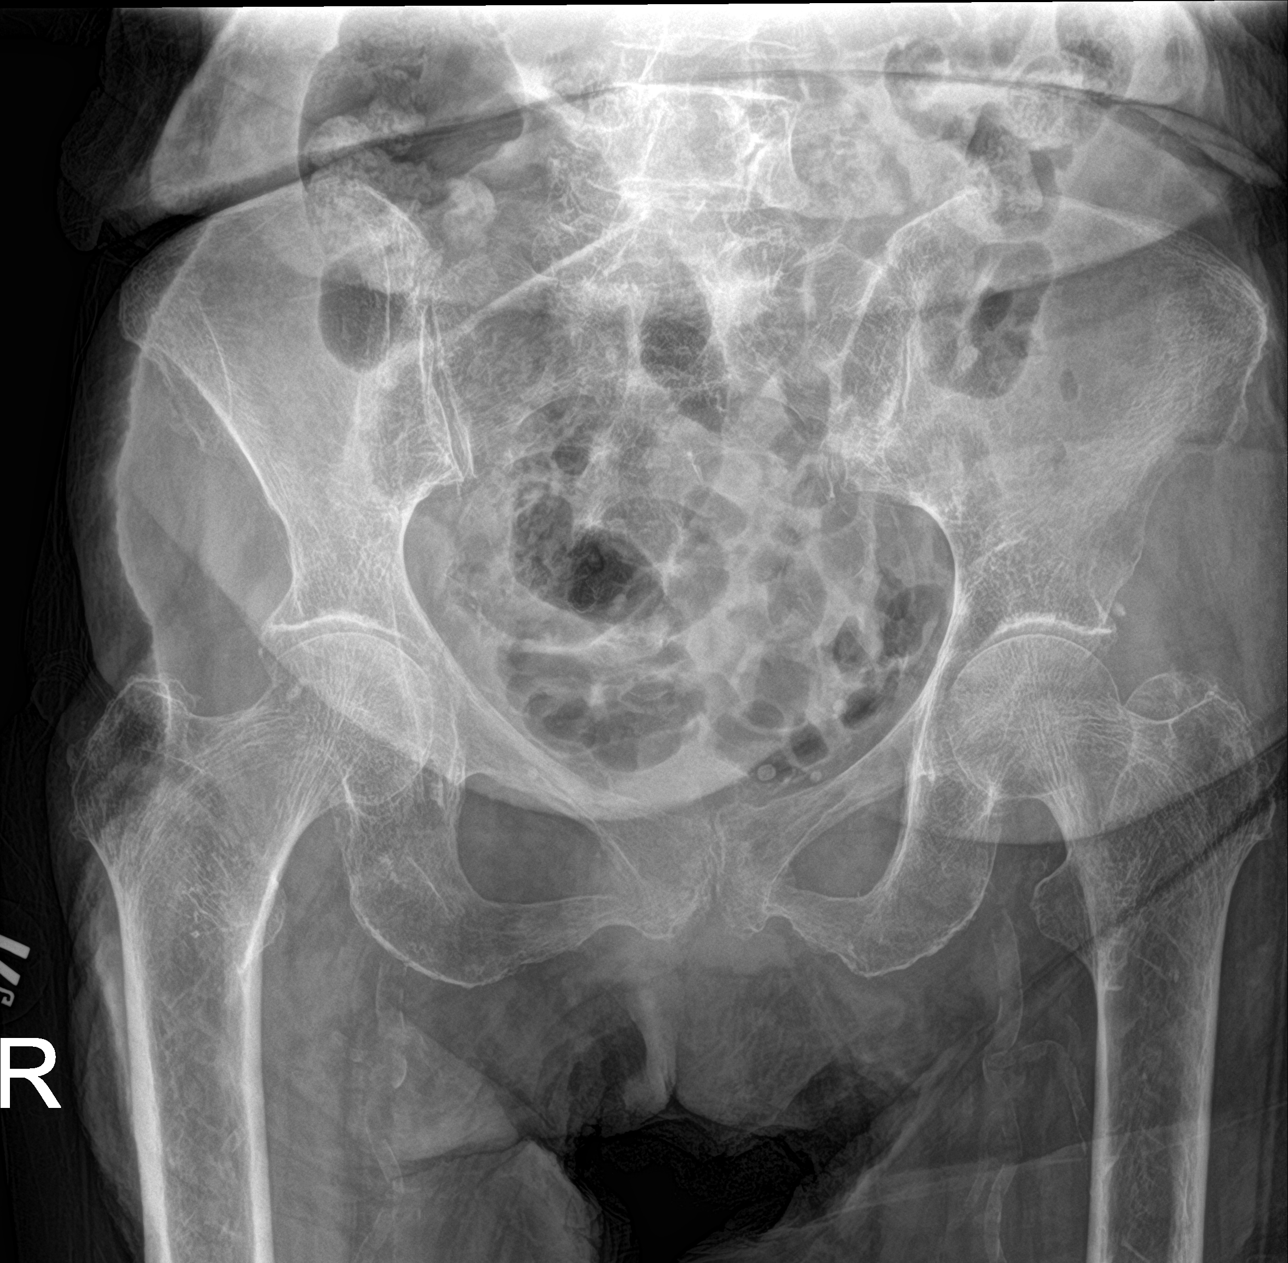

[3 of 3 positions shown; findings below may reference images not displayed]

FINDINGS: Single view of the pelvis and two views of the RIGHT hip are
provided. Osseous alignment is normal. No fracture line or displaced
fracture fragment seen. No significant degenerative change at either
hip joint. Soft tissues about the pelvis and RIGHT hip are
unremarkable.
IMPRESSION: Negative.
